# Patient Record
Sex: Male | Born: 1950 | Race: White | Hispanic: No | Marital: Married | Smoking: Former smoker
Health system: Southern US, Community
[De-identification: ages and names within clinical notes are randomized; demographics above are authoritative.]

## PROBLEM LIST (undated history)

## (undated) DIAGNOSIS — M1990 Unspecified osteoarthritis, unspecified site: Secondary | ICD-10-CM

## (undated) HISTORY — PX: OTHER SURGICAL HISTORY: SHX169

## (undated) HISTORY — PX: APPENDECTOMY: SHX54

---

## 1980-08-16 HISTORY — PX: OTHER SURGICAL HISTORY: SHX169

## 1998-05-23 ENCOUNTER — Other Ambulatory Visit: Admission: RE | Admit: 1998-05-23 | Discharge: 1998-05-23 | Payer: Self-pay | Admitting: Urology

## 2001-08-16 HISTORY — PX: OTHER SURGICAL HISTORY: SHX169

## 2002-09-18 ENCOUNTER — Encounter: Payer: Self-pay | Admitting: Neurosurgery

## 2002-09-20 ENCOUNTER — Inpatient Hospital Stay (HOSPITAL_COMMUNITY): Admission: RE | Admit: 2002-09-20 | Discharge: 2002-09-21 | Payer: Self-pay | Admitting: Neurosurgery

## 2011-09-22 ENCOUNTER — Ambulatory Visit (INDEPENDENT_AMBULATORY_CARE_PROVIDER_SITE_OTHER): Payer: BC Managed Care – PPO | Admitting: Family Medicine

## 2011-09-22 VITALS — BP 129/84 | HR 77 | Temp 97.9°F | Resp 16 | Ht 69.5 in | Wt 202.6 lb

## 2011-09-22 DIAGNOSIS — M542 Cervicalgia: Secondary | ICD-10-CM

## 2011-09-22 DIAGNOSIS — M545 Low back pain, unspecified: Secondary | ICD-10-CM

## 2011-09-22 DIAGNOSIS — M5430 Sciatica, unspecified side: Secondary | ICD-10-CM

## 2011-09-22 DIAGNOSIS — M79605 Pain in left leg: Secondary | ICD-10-CM

## 2011-09-22 NOTE — Progress Notes (Signed)
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 He has a history of spinal problems dating back to 1982 when, having back pain and left hip pain, he was found to have a disc problem in his neck. After the myelogram, Dr. long to spinal fusion in his symptoms resolve. He was fine until 2004 when he developed hand numbness leg pain. He had a trial of steroids which did not help. Her sugars spinal fusion with plate implants which resolved the problem. He has had intermittent stiff neck. He is taking ibuprofen for a while but about 6 months ago he developed left foot pain and numbness and started on Naprosyn that helped but in the last month or so he developed bilateral left lower extremity pain and numbness in left leg. He said some associated constipation but no other urinary problems or abdominal problems.  Objective: Patient's in no acute distress.  Neck range of motion is diminished with decreased rotation to the left. Reflexes show decreased are absent ankle jerks. knee jerks are normal. straight leg raising is normal.  Left knee crossover test rotation of pelvis is exquisitely tender. Abdomen is negative.  Skin is warm and dry.  There is no flank tenderness.  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 Sciatica which is worsening  Plan: Arrange for MRI of neck and lumbar spines and proceed depending on results.

## 2011-09-28 ENCOUNTER — Emergency Department (HOSPITAL_COMMUNITY): Payer: BC Managed Care – PPO

## 2011-09-28 ENCOUNTER — Emergency Department (HOSPITAL_COMMUNITY)
Admission: EM | Admit: 2011-09-28 | Discharge: 2011-09-28 | Disposition: A | Discharge status: Home / Self Care | Payer: BC Managed Care – PPO | Attending: Emergency Medicine | Admitting: Emergency Medicine

## 2011-09-28 ENCOUNTER — Encounter (HOSPITAL_COMMUNITY): Payer: Self-pay | Admitting: *Deleted

## 2011-09-28 DIAGNOSIS — M25519 Pain in unspecified shoulder: Secondary | ICD-10-CM | POA: Insufficient documentation

## 2011-09-28 DIAGNOSIS — M4802 Spinal stenosis, cervical region: Secondary | ICD-10-CM | POA: Insufficient documentation

## 2011-09-28 DIAGNOSIS — R29898 Other symptoms and signs involving the musculoskeletal system: Secondary | ICD-10-CM | POA: Insufficient documentation

## 2011-09-28 DIAGNOSIS — K5900 Constipation, unspecified: Secondary | ICD-10-CM | POA: Insufficient documentation

## 2011-09-28 DIAGNOSIS — M545 Low back pain, unspecified: Secondary | ICD-10-CM | POA: Insufficient documentation

## 2011-09-28 DIAGNOSIS — M48061 Spinal stenosis, lumbar region without neurogenic claudication: Secondary | ICD-10-CM

## 2011-09-28 DIAGNOSIS — Z981 Arthrodesis status: Secondary | ICD-10-CM | POA: Insufficient documentation

## 2011-09-28 DIAGNOSIS — M79609 Pain in unspecified limb: Secondary | ICD-10-CM | POA: Insufficient documentation

## 2011-09-28 DIAGNOSIS — R209 Unspecified disturbances of skin sensation: Secondary | ICD-10-CM | POA: Insufficient documentation

## 2011-09-28 MED ORDER — OXYCODONE-ACETAMINOPHEN 5-325 MG PO TABS: 1.0000 | ORAL_TABLET | ORAL | Status: DC | PRN

## 2011-09-28 MED ORDER — OXYCODONE-ACETAMINOPHEN 5-325 MG PO TABS: 2.0000 | ORAL_TABLET | ORAL | Status: DC | PRN

## 2011-09-28 MED ORDER — DEXAMETHASONE SODIUM PHOSPHATE 10 MG/ML IJ SOLN
10.0000 mg | Freq: Once | INTRAMUSCULAR | Status: AC
  Administered 2011-09-28: 10 mg via INTRAMUSCULAR
  Filled 2011-09-28: qty 1

## 2011-09-28 NOTE — ED Provider Notes (Signed)
Dr. Golda Acre has discussed patient with Dr. Epifania Gore.  Patient to follow-up with Dr. Phoebe Perch in the office--call tomorrow to schedule appointment.  Jimmye Norman, NP 09/28/11 [phone]

## 2011-09-28 NOTE — ED Notes (Signed)
Awaiting mri for bilateral foot numbness, left hand numbness, increased radiating lower back with movement, and electrical sensation radiating down left leg. States sx x 2 weeks but sx have gradually changing from a painful process to one of primarily numbness. Pt can ambulate but this is very painful after 65ft. Pt in no acute distress, a&ox4.

## 2011-09-28 NOTE — ED Notes (Signed)
Returned from mri 

## 2011-09-28 NOTE — ED Provider Notes (Signed)
History     CSN: [id number]  Arrival date & time 09/28/11  4098   First MD Initiated Contact with Patient 09/28/11 1025      Chief Complaint  Patient presents with  . Back Pain  . Numbness    (Consider location/radiation/quality/duration/timing/severity/associated sxs/prior treatment) Patient is a 61 y.o. male presenting with back pain. The history is provided by the patient.  Back Pain  This is a new problem. The current episode started more than 1 week ago. The problem occurs constantly. The problem has been gradually worsening. Associated symptoms include numbness and weakness. Pertinent negatives include no fever and no abdominal pain.  Pt states he is having pain in his lower back. States it started about 2 wks ago when he was shoveling snow. Gradually pain worsened, now radiating down both legs, left worse then right. States numbness to both feet. Weakness to left leg. States also weakness to both hands, has been dropping things in the last few days. Was seen by his PCP. Had MR ordered, but never had it done. States today, heart time ambulating. Denies fever, chills, loss of bowels, urinary retention or incontinence.   History reviewed. No pertinent past medical history.  Past Surgical History  Procedure Date  . Neck fusion     History reviewed. No pertinent family history.  History  Substance Use Topics  . Smoking status: Former Smoker    Quit date: 09/21/2008  . Smokeless tobacco: Not on file  . Alcohol Use: Not on file      Review of Systems  Constitutional: Negative for fever and chills.  HENT: Negative.   Eyes: Negative.   Respiratory: Negative.   Cardiovascular: Negative.   Gastrointestinal: Positive for constipation. Negative for abdominal pain and diarrhea.  Genitourinary: Negative for difficulty urinating.  Musculoskeletal: Positive for back pain and gait problem.  Skin: Negative.   Neurological: Positive for weakness and numbness.    Psychiatric/Behavioral: Negative.     Allergies  Review of patient's allergies indicates no known allergies.  Home Medications   Current Outpatient Rx  Name Route Sig Dispense Refill  . NAPROXEN SODIUM 220 MG PO TABS Oral Take 220 mg by mouth. 3 tabs am 3 tabs pm      BP 151/91  Pulse 99  Temp(Src) 98.4 F (36.9 C) (Oral)  Resp 20  SpO2 99%  Physical Exam  Nursing note and vitals reviewed. Constitutional: He is oriented to person, place, and time. He appears well-developed and well-nourished. No distress.  HENT:  Head: Normocephalic and atraumatic.  Eyes: Conjunctivae are normal.  Neck: Neck supple.  Cardiovascular: Normal rate, regular rhythm and normal heart sounds.   Pulmonary/Chest: Effort normal and breath sounds normal. No respiratory distress.  Abdominal: Soft. Bowel sounds are normal. He exhibits no distension.  Musculoskeletal: Normal range of motion. He exhibits no edema.       No midline cervical, thoracic, or lumbar tenderness. Pain with left straight leg raise.  Neurological: He is alert and oriented to person, place, and time.       Decreased sensation over lateral thighs bilaterally, lateral shins, dorsum of bilateral feet. Unable to dorsiflex left great toe. 5/5 grip strength bilaterally, however left grip is weaker then right. 2+ right patellar reflex, 1+ left patellar reflex  Skin: Skin is warm and dry. No rash noted. No erythema.  Psychiatric: He has a normal mood and affect.    ED Course  Procedures (including critical care time)  Pt with neuro deficits. Will get  MRI of cervical and lumbar spine to r/o cord compression.  No results found for this or any previous visit. Mr Cervical Spine Wo Contrast  09/28/2011  *RADIOLOGY REPORT*  Clinical Data: Neck pain radiating to both shoulders, especially the left.  Bilateral hand numbness, mostly left.  MRI CERVICAL SPINE WITHOUT CONTRAST  Technique:  Multiplanar and multiecho pulse sequences of the cervical  spine, to include the craniocervical junction and cervicothoracic junction, were obtained according to standard protocol without intravenous contrast.  Comparison: None.  Findings: The patient has undergone previous cervical fusions at C3- 4 and C5-C7.  C6-7 level appears solidly fused.  The other levels are incompletely evaluated due to susceptibility artifact.  Plain films could help assess the presence or absence of solid fusion.  There is anatomic alignment except for 3-4 mm of anterolisthesis C7 on T1 which is facet mediated.  This is a fairly significant amount of anterolisthesis for the cervical spine, particularly if there is dynamic instability.  Abnormal cord signal is seen at C3-4 and C5-6.  This may relate to cervical compression which predated the surgery.  Overall longus the sense of congenital stenosis with short pedicles.  There is a paucity of CSF surrounding the cortical levels.  The individual disc spaces were examined as follows:  C2-3:  Mild bulge. No protrusion or cord compression.  C3-4:  Left greater than right abnormal intramedullary signal likely representing gliosis. No current cord compression.  Residual osteophytic spurring is greater on the left.  Left greater than right C4 nerve root encroachment is likely.  C4-5:  The C4-5 level is unfused.  There is a central protrusion which flattens the cord and compresses both exiting C5 nerve roots. Canal diameter 7 mm.  No abnormal cord signal.  C5-6:  Previous ACDF.  It is not clear if this level is fused. Slight left greater than right abnormal cord signal, also likely gliosis from previous compression.  Residual osteophytic spurring on the left narrows the foramen and likely compresses the C6 nerve root. No residual cord compression.  C6-7:    Solid fusion.  No residual neural compression.  C7-T1:  3-4 mm of facet mediated slip.  Central bulging annular fibers.  Cord compression without abnormal cord signal.  Bilateral left greater than right  C8 nerve root encroachment.  IMPRESSION: Multilevel spondylosis as described. There are multiple areas of neural compression related to previously fused and unfused levels.  Abnormal cord signal at C3-4 and C5-6 likely predated surgical intervention, reflecting gliosis from longstanding cord compression.  See comments above.  Original Report Authenticated By: Elsie Stain, M.D.   Mr Lumbar Spine Wo Contrast  09/28/2011  *RADIOLOGY REPORT*  Clinical Data: Low back pain.  Bilateral feet numbness, worse on the left.  MRI LUMBAR SPINE WITHOUT CONTRAST  Technique:  Multiplanar and multiecho pulse sequences of the lumbar spine were obtained without intravenous contrast.  Comparison: None.  Findings: No vertebral body compression fracture.  Multilevel disc space narrowing.  1-2 mm anterolisthesis L3 forward on L4 is facet mediated. 2-3 mm retrolisthesis L5 on S1 is facet mediated also. Normal conus.  Congenital stenosis with short pedicles.  Marrow signal homogeneous.  No prevertebral or paraspinous masses.  The individual disc spaces are examined as follows:  L1-2:  There is a central and rightward protrusion.  Mild facet arthropathy.  Mild central canal stenosis.  Right L2 nerve root encroachment.  L2-3:  Moderate to severe congenital and acquired stenosis, with posterior element hypertrophy and central/leftward protrusion.  Left greater than right L3 nerve root encroachment.  No significant foraminal narrowing.  L3-4:  Moderate to severe congenital and acquired stenosis. Advanced posterior element hypertrophy.  Central and leftward protrusion extends into the left neural foramen.  Left greater than right L4 nerve root encroachment in the canal.  Left L3 nerve encroachment in the foramen.  L4-5:  Moderate to severe congenital and acquired stenosis. Posterior element hypertrophy.  Central and leftward protrusion. Left greater than right L5 nerve root encroachment.  Bilateral foraminal narrowing, greater on the left,  likely affects the left L4 nerve root.  L5-S1:  2 mm facet mediated retrolisthesis L5 on S1.  Central and leftward protrusion.  Moderate facet arthropathy.  Left S1 nerve root compression in the canal.  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  There is predominantly left sided nerve root encroachment at each of those levels.  See comments above.  Original Report Authenticated By: Elsie Stain, M.D.    MRI resulted. Dr. Golda Acre to discuss pt's case with Dr. Blanche East, disposition by PA Katrinka Blazing.  No diagnosis found.    MDM          Lottie Mussel, PA 09/29/11 1512

## 2011-09-28 NOTE — ED Notes (Signed)
Awaiting neuro consult.

## 2011-09-28 NOTE — ED Notes (Signed)
Pt sitting up in bed watching TV with no verbal complaints at this time.  Awaiting evaluation from neurology.

## 2011-09-28 NOTE — ED Notes (Addendum)
Medical screening examination/treatment/procedure(s) were conducted as a shared visit with non-physician practitioner(s) and myself.  I personally evaluated the patient during the encounter  Patient presents with worsening numbness in the medial aspect of his left forearm and fifth digit as well as worsening weakness and pain down his left leg.  Patient notes that started abruptly one to 2 weeks ago with no specific inciting injury.  He has had 2 past cervical spine surgeries.  He notes that the pain in particular in his leg is getting worse and making it more difficult to ambulate as he is Dara crutches from a family member to assist him.  He notes he can walk short distances but on longer distances beyond going across a room he starts to lose his balance as he feels the numbness in his feet are causing difficulties.  Patient has difficulty with dorsiflexing his left foot as well.   Pt did have an MRI completed which does show some faded which shows some disc protrusion on multiple lumbar levels as well cervical levels on that could certainly give the patient his symptoms he is described.  I am waiting to talk with neurosurgery to discuss further followup for this patient.  Nat Christen, MD 09/28/11 1623  Patient was discussed with Dr. Danielle Dess and he agrees to this patient to be followed up as an outpatient with Dr. Phoebe Perch.  He has actually seen the patient's MRIs and does not see anything acute at this time.  Nat Christen, MD 09/28/11 [phone]

## 2011-09-28 NOTE — ED Notes (Signed)
Pt states after shoveling snow a cple weeks ago he started to have lower back pain. Seen by [facility]cc and had MRI done. Now with numbness in both feet, left upper arm, and left small finger. States he has had neck surgery with same symptoms.

## 2011-09-28 NOTE — ED Notes (Signed)
Patient transported to MRI 

## 2011-09-28 NOTE — ED Notes (Signed)
Pt states that he has taken 3 naproxen this morning. States that his pain with movement is about a 6 and when he is just sitting pain is approximately a 2

## 2011-10-01 ENCOUNTER — Other Ambulatory Visit: Payer: Self-pay | Admitting: Family Medicine

## 2011-10-01 DIAGNOSIS — M79604 Pain in right leg: Secondary | ICD-10-CM

## 2011-10-01 NOTE — ED Provider Notes (Signed)
Medical screening examination/treatment/procedure(s) were conducted as a shared visit with non-physician practitioner(s) and myself.  I personally evaluated the patient during the encounter   Wania Longstreth A Alphonsine Minium, MD 10/01/11 0901 

## 2011-10-01 NOTE — ED Provider Notes (Signed)
Medical screening examination/treatment/procedure(s) were conducted as a shared visit with non-physician practitioner(s) and myself.  I personally evaluated the patient during the encounter   Nat Christen, MD 10/01/11 [phone]

## 2011-10-06 ENCOUNTER — Other Ambulatory Visit: Payer: Self-pay | Admitting: Neurosurgery

## 2011-10-08 ENCOUNTER — Encounter (HOSPITAL_COMMUNITY): Payer: Self-pay | Admitting: Pharmacy Technician

## 2011-10-08 ENCOUNTER — Encounter (HOSPITAL_COMMUNITY): Payer: Self-pay | Admitting: *Deleted

## 2011-10-10 MED ORDER — CEFAZOLIN SODIUM-DEXTROSE 2-3 GM-% IV SOLR
2.0000 g | INTRAVENOUS | Status: AC
  Administered 2011-10-11: 2 g via INTRAVENOUS
  Filled 2011-10-10: qty 50

## 2011-10-11 ENCOUNTER — Encounter (HOSPITAL_COMMUNITY): Payer: Self-pay | Admitting: *Deleted

## 2011-10-11 ENCOUNTER — Encounter (HOSPITAL_COMMUNITY): Payer: Self-pay

## 2011-10-11 ENCOUNTER — Inpatient Hospital Stay (HOSPITAL_COMMUNITY)
Admission: RE | Admit: 2011-10-11 | Discharge: 2011-10-12 | DRG: 758 | Disposition: A | Discharge status: Home / Self Care | Payer: BC Managed Care – PPO | Source: Ambulatory Visit | Attending: Neurosurgery | Admitting: Neurosurgery

## 2011-10-11 ENCOUNTER — Ambulatory Visit (HOSPITAL_COMMUNITY): Payer: BC Managed Care – PPO | Admitting: *Deleted

## 2011-10-11 ENCOUNTER — Encounter (HOSPITAL_COMMUNITY)
Admission: RE | Disposition: A | Discharge status: Home / Self Care | Payer: Self-pay | Source: Ambulatory Visit | Attending: Neurosurgery

## 2011-10-11 ENCOUNTER — Ambulatory Visit (HOSPITAL_COMMUNITY): Payer: BC Managed Care – PPO

## 2011-10-11 DIAGNOSIS — M5126 Other intervertebral disc displacement, lumbar region: Secondary | ICD-10-CM | POA: Diagnosis present

## 2011-10-11 DIAGNOSIS — M48061 Spinal stenosis, lumbar region without neurogenic claudication: Principal | ICD-10-CM | POA: Diagnosis present

## 2011-10-11 DIAGNOSIS — Z79899 Other long term (current) drug therapy: Secondary | ICD-10-CM

## 2011-10-11 HISTORY — PX: LUMBAR LAMINECTOMY/DECOMPRESSION MICRODISCECTOMY: SHX5026

## 2011-10-11 HISTORY — DX: Unspecified osteoarthritis, unspecified site: M19.90

## 2011-10-11 LAB — CBC
MCHC: 35.9 g/dL (ref 30.0–36.0)
MCV: 95.9 fL (ref 78.0–100.0)
Platelets: 322 10*3/uL (ref 150–400)
RDW: 13.5 % (ref 11.5–15.5)
WBC: 12 10*3/uL — ABNORMAL HIGH (ref 4.0–10.5)

## 2011-10-11 LAB — URINALYSIS, ROUTINE W REFLEX MICROSCOPIC
Bilirubin Urine: NEGATIVE
Glucose, UA: NEGATIVE mg/dL
Hgb urine dipstick: NEGATIVE
Ketones, ur: 15 mg/dL — AB
Protein, ur: NEGATIVE mg/dL
Urobilinogen, UA: 0.2 mg/dL (ref 0.0–1.0)

## 2011-10-11 LAB — SURGICAL PCR SCREEN: MRSA, PCR: NEGATIVE

## 2011-10-11 LAB — BASIC METABOLIC PANEL
BUN: 24 mg/dL — ABNORMAL HIGH (ref 6–23)
CO2: 23 mEq/L (ref 19–32)
Calcium: 9.1 mg/dL (ref 8.4–10.5)
Creatinine, Ser: 0.9 mg/dL (ref 0.50–1.35)
GFR calc Af Amer: >90 mL/min (ref >90)

## 2011-10-11 LAB — DIFFERENTIAL
Basophils Absolute: 0.1 10*3/uL (ref 0.0–0.1)
Basophils Relative: 1 % (ref 0–1)
Eosinophils Relative: 1 % (ref 0–5)
Lymphocytes Relative: 20 % (ref 12–46)
Monocytes Absolute: 1.1 10*3/uL — ABNORMAL HIGH (ref 0.1–1.0)

## 2011-10-11 LAB — PROTIME-INR: Prothrombin Time: 14.7 seconds (ref 11.6–15.2)

## 2011-10-11 LAB — APTT: aPTT: 34 seconds (ref 24–37)

## 2011-10-11 SURGERY — LUMBAR LAMINECTOMY/DECOMPRESSION MICRODISCECTOMY 4 LEVEL
Anesthesia: General | Site: Back | Wound class: Clean

## 2011-10-11 MED ORDER — MUPIROCIN 2 % EX OINT
TOPICAL_OINTMENT | Freq: Two times a day (BID) | CUTANEOUS | Status: DC
  Administered 2011-10-11: 22:00:00 via NASAL
  Administered 2011-10-12: 1 via NASAL
  Filled 2011-10-11: qty 22

## 2011-10-11 MED ORDER — FENTANYL CITRATE 0.05 MG/ML IJ SOLN
INTRAMUSCULAR | Status: DC | PRN
  Administered 2011-10-11: 50 ug via INTRAVENOUS
  Administered 2011-10-11 (×2): 100 ug via INTRAVENOUS
  Administered 2011-10-11: 50 ug via INTRAVENOUS
  Administered 2011-10-11 (×2): 25 ug via INTRAVENOUS
  Administered 2011-10-11: 50 ug via INTRAVENOUS

## 2011-10-11 MED ORDER — PROMETHAZINE HCL 25 MG/ML IJ SOLN: 12.5000 mg | INTRAMUSCULAR | Status: DC | PRN

## 2011-10-11 MED ORDER — KCL IN DEXTROSE-NACL 20-5-0.45 MEQ/L-%-% IV SOLN
INTRAVENOUS | Status: DC
  Filled 2011-10-11 (×5): qty 1000

## 2011-10-11 MED ORDER — HYDROCODONE-ACETAMINOPHEN 5-325 MG PO TABS: 1.0000 | ORAL_TABLET | ORAL | Status: DC | PRN

## 2011-10-11 MED ORDER — KETOROLAC TROMETHAMINE 30 MG/ML IJ SOLN
30.0000 mg | Freq: Four times a day (QID) | INTRAMUSCULAR | Status: DC
  Administered 2011-10-11 – 2011-10-12 (×3): 30 mg via INTRAVENOUS
  Filled 2011-10-11 (×4): qty 1

## 2011-10-11 MED ORDER — PROMETHAZINE HCL 25 MG/ML IJ SOLN: 6.2500 mg | INTRAMUSCULAR | Status: DC | PRN

## 2011-10-11 MED ORDER — KCL IN DEXTROSE-NACL 20-5-0.45 MEQ/L-%-% IV SOLN
INTRAVENOUS | Status: AC
  Filled 2011-10-11: qty 1000

## 2011-10-11 MED ORDER — MAGNESIUM HYDROXIDE 400 MG/5ML PO SUSP: 30.0000 mL | Freq: Every day | ORAL | Status: DC | PRN

## 2011-10-11 MED ORDER — CEFAZOLIN SODIUM 1-5 GM-% IV SOLN
1.0000 g | Freq: Three times a day (TID) | INTRAVENOUS | Status: AC
  Administered 2011-10-11 – 2011-10-12 (×2): 1 g via INTRAVENOUS
  Filled 2011-10-11 (×2): qty 50

## 2011-10-11 MED ORDER — LACTATED RINGERS IV SOLN: INTRAVENOUS | Status: DC

## 2011-10-11 MED ORDER — ACETAMINOPHEN 325 MG PO TABS: 650.0000 mg | ORAL_TABLET | ORAL | Status: DC | PRN

## 2011-10-11 MED ORDER — LIDOCAINE-EPINEPHRINE 1 %-1:100000 IJ SOLN
INTRAMUSCULAR | Status: DC | PRN
  Administered 2011-10-11: 20 mL

## 2011-10-11 MED ORDER — MORPHINE SULFATE 4 MG/ML IJ SOLN: 1.0000 mg | INTRAMUSCULAR | Status: DC | PRN

## 2011-10-11 MED ORDER — EPHEDRINE SULFATE 50 MG/ML IJ SOLN
INTRAMUSCULAR | Status: DC | PRN
  Administered 2011-10-11 (×2): 5 mg via INTRAVENOUS

## 2011-10-11 MED ORDER — ONDANSETRON HCL 4 MG/2ML IJ SOLN
INTRAMUSCULAR | Status: DC | PRN
  Administered 2011-10-11: 4 mg via INTRAVENOUS

## 2011-10-11 MED ORDER — NEOSTIGMINE METHYLSULFATE 1 MG/ML IJ SOLN
INTRAMUSCULAR | Status: DC | PRN
  Administered 2011-10-11: 4 mg via INTRAVENOUS

## 2011-10-11 MED ORDER — 0.9 % SODIUM CHLORIDE (POUR BTL) OPTIME
TOPICAL | Status: DC | PRN
  Administered 2011-10-11: 1000 mL

## 2011-10-11 MED ORDER — THROMBIN 5000 UNITS EX KIT
PACK | CUTANEOUS | Status: DC | PRN
  Administered 2011-10-11 (×2): 5000 [IU] via TOPICAL

## 2011-10-11 MED ORDER — PROPOFOL 10 MG/ML IV EMUL
INTRAVENOUS | Status: DC | PRN
  Administered 2011-10-11: 200 mg via INTRAVENOUS

## 2011-10-11 MED ORDER — SODIUM CHLORIDE 0.9 % IR SOLN
Status: DC | PRN
  Administered 2011-10-11: 12:00:00

## 2011-10-11 MED ORDER — ZOLPIDEM TARTRATE 5 MG PO TABS: 10.0000 mg | ORAL_TABLET | Freq: Every evening | ORAL | Status: DC | PRN

## 2011-10-11 MED ORDER — SODIUM CHLORIDE 0.9 % IJ SOLN
3.0000 mL | Freq: Two times a day (BID) | INTRAMUSCULAR | Status: DC
  Administered 2011-10-11 – 2011-10-12 (×2): 3 mL via INTRAVENOUS

## 2011-10-11 MED ORDER — MUPIROCIN 2 % EX OINT
TOPICAL_OINTMENT | CUTANEOUS | Status: AC
  Administered 2011-10-11: 1 via NASAL
  Filled 2011-10-11: qty 22

## 2011-10-11 MED ORDER — DOCUSATE SODIUM 100 MG PO CAPS
100.0000 mg | ORAL_CAPSULE | Freq: Two times a day (BID) | ORAL | Status: DC
  Administered 2011-10-11 – 2011-10-12 (×2): 100 mg via ORAL
  Filled 2011-10-11: qty 1

## 2011-10-11 MED ORDER — MIDAZOLAM HCL 2 MG/2ML IJ SOLN
INTRAMUSCULAR | Status: AC
  Filled 2011-10-11: qty 2

## 2011-10-11 MED ORDER — PHENOL 1.4 % MT LIQD: 1.0000 | OROMUCOSAL | Status: DC | PRN

## 2011-10-11 MED ORDER — FLEET ENEMA 7-19 GM/118ML RE ENEM
1.0000 | ENEMA | Freq: Once | RECTAL | Status: AC | PRN
  Filled 2011-10-11: qty 1

## 2011-10-11 MED ORDER — BACITRACIN 50000 UNITS IM SOLR
INTRAMUSCULAR | Status: AC
  Filled 2011-10-11: qty 1

## 2011-10-11 MED ORDER — MORPHINE SULFATE 4 MG/ML IJ SOLN
INTRAMUSCULAR | Status: AC
  Administered 2011-10-11: 4 mg
  Filled 2011-10-11: qty 1

## 2011-10-11 MED ORDER — ONDANSETRON HCL 4 MG/2ML IJ SOLN: 4.0000 mg | INTRAMUSCULAR | Status: DC | PRN

## 2011-10-11 MED ORDER — GLYCOPYRROLATE 0.2 MG/ML IJ SOLN
INTRAMUSCULAR | Status: DC | PRN
  Administered 2011-10-11: .8 mg via INTRAVENOUS

## 2011-10-11 MED ORDER — METHOCARBAMOL 100 MG/ML IJ SOLN
500.0000 mg | Freq: Four times a day (QID) | INTRAVENOUS | Status: DC | PRN
  Administered 2011-10-11: 500 mg via INTRAVENOUS
  Filled 2011-10-11: qty 5

## 2011-10-11 MED ORDER — ACETAMINOPHEN 650 MG RE SUPP: 650.0000 mg | RECTAL | Status: DC | PRN

## 2011-10-11 MED ORDER — HYDROMORPHONE HCL PF 1 MG/ML IJ SOLN
0.2500 mg | INTRAMUSCULAR | Status: DC | PRN
  Administered 2011-10-11: 0.5 mg via INTRAVENOUS

## 2011-10-11 MED ORDER — ROCURONIUM BROMIDE 100 MG/10ML IV SOLN
INTRAVENOUS | Status: DC | PRN
  Administered 2011-10-11: 50 mg via INTRAVENOUS
  Administered 2011-10-11 (×3): 10 mg via INTRAVENOUS

## 2011-10-11 MED ORDER — MENTHOL 3 MG MT LOZG: 1.0000 | LOZENGE | OROMUCOSAL | Status: DC | PRN

## 2011-10-11 MED ORDER — CYCLOBENZAPRINE HCL 10 MG PO TABS
10.0000 mg | ORAL_TABLET | Freq: Three times a day (TID) | ORAL | Status: DC | PRN
  Administered 2011-10-11 – 2011-10-12 (×2): 10 mg via ORAL
  Filled 2011-10-11 (×2): qty 1

## 2011-10-11 MED ORDER — SODIUM CHLORIDE 0.9 % IV SOLN
INTRAVENOUS | Status: AC
  Filled 2011-10-11: qty 500

## 2011-10-11 MED ORDER — BISACODYL 10 MG RE SUPP: 10.0000 mg | Freq: Every day | RECTAL | Status: DC | PRN

## 2011-10-11 MED ORDER — HEMOSTATIC AGENTS (NO CHARGE) OPTIME
TOPICAL | Status: DC | PRN
  Administered 2011-10-11: 1 via TOPICAL

## 2011-10-11 MED ORDER — LACTATED RINGERS IV SOLN
INTRAVENOUS | Status: DC | PRN
  Administered 2011-10-11 (×3): via INTRAVENOUS

## 2011-10-11 MED ORDER — PROMETHAZINE HCL 25 MG PO TABS: 12.5000 mg | ORAL_TABLET | ORAL | Status: DC | PRN

## 2011-10-11 MED ORDER — METHOCARBAMOL 500 MG PO TABS
500.0000 mg | ORAL_TABLET | Freq: Four times a day (QID) | ORAL | Status: DC | PRN
Start: 2011-10-11 — End: 2011-10-12
  Administered 2011-10-12: 500 mg via ORAL
  Filled 2011-10-11 (×2): qty 1

## 2011-10-11 MED ORDER — SODIUM CHLORIDE 0.9 % IJ SOLN: 3.0000 mL | INTRAMUSCULAR | Status: DC | PRN

## 2011-10-11 MED ORDER — OXYCODONE-ACETAMINOPHEN 5-325 MG PO TABS
1.0000 | ORAL_TABLET | ORAL | Status: DC | PRN
  Administered 2011-10-11 – 2011-10-12 (×4): 2 via ORAL
  Filled 2011-10-11 (×4): qty 2

## 2011-10-11 MED ORDER — MIDAZOLAM HCL 5 MG/5ML IJ SOLN
INTRAMUSCULAR | Status: DC | PRN
  Administered 2011-10-11: 2 mg via INTRAVENOUS

## 2011-10-11 MED ORDER — KETOROLAC TROMETHAMINE 30 MG/ML IJ SOLN
INTRAMUSCULAR | Status: AC
  Administered 2011-10-11: 30 mg
  Filled 2011-10-11: qty 1

## 2011-10-11 MED ORDER — HYDROMORPHONE HCL PF 1 MG/ML IJ SOLN
INTRAMUSCULAR | Status: AC
  Filled 2011-10-11: qty 1

## 2011-10-11 SURGICAL SUPPLY — 58 items
APL SKNCLS STERI-STRIP NONHPOA (GAUZE/BANDAGES/DRESSINGS) ×1
BAG DECANTER FOR FLEXI CONT (MISCELLANEOUS) ×2 IMPLANT
BENZOIN TINCTURE PRP APPL 2/3 (GAUZE/BANDAGES/DRESSINGS) ×2 IMPLANT
BLADE SURG ROTATE 9660 (MISCELLANEOUS) ×1 IMPLANT
BUR ACORN 6.0 ACORN (BURR) ×2 IMPLANT
BUR ACRON 5.0MM COATED (BURR) ×2 IMPLANT
CANISTER SUCTION 2500CC (MISCELLANEOUS) ×2 IMPLANT
CLOSURE STERI STRIP 1/2 X4 (GAUZE/BANDAGES/DRESSINGS) ×1 IMPLANT
CLOTH BEACON ORANGE TIMEOUT ST (SAFETY) ×2 IMPLANT
CONT SPEC 4OZ CLIKSEAL STRL BL (MISCELLANEOUS) ×1 IMPLANT
DRAPE LAPAROTOMY 100X72X124 (DRAPES) ×2 IMPLANT
DRAPE MICROSCOPE LEICA (MISCELLANEOUS) ×2 IMPLANT
DRAPE POUCH INSTRU U-SHP 10X18 (DRAPES) ×2 IMPLANT
DRAPE SURG 17X23 STRL (DRAPES) ×2 IMPLANT
DRESSING TELFA 8X3 (GAUZE/BANDAGES/DRESSINGS) ×2 IMPLANT
DURAPREP 26ML APPLICATOR (WOUND CARE) ×2 IMPLANT
ELECT REM PT RETURN 9FT ADLT (ELECTROSURGICAL) ×2
ELECTRODE REM PT RTRN 9FT ADLT (ELECTROSURGICAL) ×1 IMPLANT
GAUZE SPONGE 4X4 12PLY STRL LF (GAUZE/BANDAGES/DRESSINGS) ×1 IMPLANT
GAUZE SPONGE 4X4 16PLY XRAY LF (GAUZE/BANDAGES/DRESSINGS) ×1 IMPLANT
GLOVE BIOGEL PI IND STRL 7.0 (GLOVE) IMPLANT
GLOVE BIOGEL PI IND STRL 7.5 (GLOVE) IMPLANT
GLOVE BIOGEL PI INDICATOR 7.0 (GLOVE) ×2
GLOVE BIOGEL PI INDICATOR 7.5 (GLOVE) ×1
GLOVE ECLIPSE 7.5 STRL STRAW (GLOVE) ×4 IMPLANT
GLOVE EXAM NITRILE LRG STRL (GLOVE) IMPLANT
GLOVE EXAM NITRILE MD LF STRL (GLOVE) ×1 IMPLANT
GLOVE EXAM NITRILE XL STR (GLOVE) IMPLANT
GLOVE EXAM NITRILE XS STR PU (GLOVE) IMPLANT
GLOVE OPTIFIT SS 6.5 STRL BRWN (GLOVE) ×3 IMPLANT
GOWN BRE IMP SLV AUR LG STRL (GOWN DISPOSABLE) ×3 IMPLANT
GOWN BRE IMP SLV AUR XL STRL (GOWN DISPOSABLE) ×3 IMPLANT
GOWN STRL REIN 2XL LVL4 (GOWN DISPOSABLE) IMPLANT
KIT BASIN OR (CUSTOM PROCEDURE TRAY) ×2 IMPLANT
KIT ROOM TURNOVER OR (KITS) ×2 IMPLANT
NDL HYPO 18GX1.5 BLUNT FILL (NEEDLE) IMPLANT
NEEDLE HYPO 18GX1.5 BLUNT FILL (NEEDLE) IMPLANT
NEEDLE HYPO 22GX1.5 SAFETY (NEEDLE) ×4 IMPLANT
NS IRRIG 1000ML POUR BTL (IV SOLUTION) ×2 IMPLANT
PACK LAMINECTOMY NEURO (CUSTOM PROCEDURE TRAY) ×2 IMPLANT
PAD ARMBOARD 7.5X6 YLW CONV (MISCELLANEOUS) ×6 IMPLANT
PATTIES SURGICAL .75X.75 (GAUZE/BANDAGES/DRESSINGS) ×2 IMPLANT
RUBBERBAND STERILE (MISCELLANEOUS) ×4 IMPLANT
SPONGE GAUZE 4X4 12PLY (GAUZE/BANDAGES/DRESSINGS) ×2 IMPLANT
SPONGE LAP 4X18 X RAY DECT (DISPOSABLE) IMPLANT
SPONGE SURGIFOAM ABS GEL SZ50 (HEMOSTASIS) ×2 IMPLANT
STRIP CLOSURE SKIN 1/2X4 (GAUZE/BANDAGES/DRESSINGS) ×2 IMPLANT
SUT PROLENE 6 0 BV (SUTURE) IMPLANT
SUT VIC AB 0 CT1 18XCR BRD8 (SUTURE) ×1 IMPLANT
SUT VIC AB 0 CT1 8-18 (SUTURE) ×4
SUT VIC AB 2-0 CP2 18 (SUTURE) ×3 IMPLANT
SUT VIC AB 3-0 SH 8-18 (SUTURE) ×3 IMPLANT
SYR 20CC LL (SYRINGE) ×2 IMPLANT
SYR 5ML LL (SYRINGE) IMPLANT
TAPE CLOTH SURG 4X10 WHT LF (GAUZE/BANDAGES/DRESSINGS) ×1 IMPLANT
TOWEL OR 17X24 6PK STRL BLUE (TOWEL DISPOSABLE) ×2 IMPLANT
TOWEL OR 17X26 10 PK STRL BLUE (TOWEL DISPOSABLE) ×2 IMPLANT
WATER STERILE IRR 1000ML POUR (IV SOLUTION) ×2 IMPLANT

## 2011-10-11 NOTE — Anesthesia Postprocedure Evaluation (Signed)
  Anesthesia Post-op Note  Patient: Roger Wilcox  Procedure(s) Performed: Procedure(s) (LRB): LUMBAR LAMINECTOMY/DECOMPRESSION MICRODISCECTOMY 4 LEVEL (N/A)  Patient Location: PACU  Anesthesia Type: General  Level of Consciousness: awake, alert  and oriented  Airway and Oxygen Therapy: Patient Spontanous Breathing and Patient connected to nasal cannula oxygen  Post-op Pain: none  Post-op Assessment: Post-op Vital signs reviewed, Patient's Cardiovascular Status Stable, Respiratory Function Stable, Patent Airway, No signs of Nausea or vomiting and Pain level controlled  Post-op Vital Signs: Reviewed and stable  Complications: No apparent anesthesia complications

## 2011-10-11 NOTE — Transfer of Care (Signed)
Immediate Anesthesia Transfer of Care Note  Patient: Roger Wilcox  Procedure(s) Performed: Procedure(s) (LRB): LUMBAR LAMINECTOMY/DECOMPRESSION MICRODISCECTOMY 4 LEVEL (N/A)  Patient Location: PACU  Anesthesia Type: General  Level of Consciousness: awake and alert   Airway & Oxygen Therapy: Patient Spontanous Breathing and Patient connected to nasal cannula oxygen  Post-op Assessment: Report given to PACU RN and Post -op Vital signs reviewed and stable  Post vital signs: Reviewed and stable  Complications: No apparent anesthesia complications

## 2011-10-11 NOTE — Op Note (Signed)
10/11/2011  1:42 PM  PATIENT:  Margit Banda Hanken  61 y.o. male  PRE-OPERATIVE DIAGNOSIS:  Lumbar stenosis  POST-OPERATIVE DIAGNOSIS:  Lumbar stenosis  PROCEDURE:  Procedure(s): LUMBAR LAMINECTOMY/DECOMPRESSION  5LEVEL  SURGEON:  Surgeon(s): Clydene Fake, MD Temple Pacini, MD  ASSISTANTS:  ANESTHESIA:   general  EBL:  Total I/O In: 2000 [I.V.:2000] Out: 600 [Urine:400; Blood:200]  BLOOD ADMINISTERED:none  DRAINS: none   SPECIMEN:  No Specimen  DICTATION: Patient with back and left leg pain of the head of the canal stenosis with spinal change causing significant stenosis from L2-S1 with disc protrusions and left-sided 34 and 51 patient brought in for decompressive laminectomy.  Patient brought into the operating room general anesthesia induced patient placed in a prone position Wilson frame all pressure points padded. Patient prepped draped sterile fashion the incision injected with 20 cc 1% lidocaine with epinephrine. Incision was then made in the midline of the lower lumbar spine. Cementing of the fascia hemostasis obtained with Bovie cauterization the fascia was incised with the Bovie and subperiosteal dissection was done with a spinous process lamina. It was done from L2-S1 bilaterally. Retractors were then placed markers were placed at the 23 and 51 disc spaces and x-rays obtained confirming or positioning.  Decompressive laminectomy was then done with rongeurs a high-speed drill and Kerrison punches removing the bone of the L2 spinous process lamina to the top part of the S1 spinous process lamina. This decompressed the central canal. Decompress the lateral gutters with high-speed drill and Kerrison punches so we had good decompression and then foraminotomies were done at L2-L3 L4-L5 and S1 bilaterally. We did decompression of nerve root and the central canal. We explored the disc spaces especially at the left-sided 3 for which the decompression we did we felt we had we did not get  into the disc space removing disc. A subsequent left-sided L5-S1 there was a paracentral protrusion of disc this is immediate removed without entering the disc space and in this case make sure we had good decompression of the nerve root.. About solution hemostasis retractors removed fascia closed with 0 interrupted sutures of his subcutaneous tissue closed with 02 0 Vicryl interrupted sutures skin closed benzoin Steri-Strips dressing was placed this was placed back in supine position transferred to recovery room  PLAN OF CARE: Admit for overnight observation  PATIENT DISPOSITION:  PACU - hemodynamically stable.

## 2011-10-11 NOTE — Interval H&P Note (Signed)
History and Physical Interval Note:  10/11/2011 10:54 AM  Roger Wilcox  has presented today for surgery, with the diagnosis of Lumbar stenosis  The various methods of treatment have been discussed with the patient and family. After consideration of risks, benefits and other options for treatment, the patient has consented to  Procedure(s) (LRB): LUMBAR LAMINECTOMY/DECOMPRESSION MICRODISCECTOMY 4 LEVEL (N/A) as a surgical intervention .  The patients' history has been reviewed, patient examined, no change in status, stable for surgery.  I have reviewed the patients' chart and labs.  Questions were answered to the patient's satisfaction.     Poetry Cerro R

## 2011-10-11 NOTE — Progress Notes (Signed)
Report to Danella Sensing, RN for primary caregiver.

## 2011-10-11 NOTE — Preoperative (Signed)
Beta Blockers   Reason not to administer Beta Blockers:Not Applicable 

## 2011-10-11 NOTE — H&P (Signed)
See H& P.

## 2011-10-11 NOTE — Anesthesia Preprocedure Evaluation (Signed)
Anesthesia Evaluation  Patient identified by MRN, date of birth, ID band Patient awake    Reviewed: Allergy & Precautions, H&P , NPO status , Patient's Chart, lab work & pertinent test results  History of Anesthesia Complications Negative for: history of anesthetic complications  Airway Mallampati: I TM Distance: >3 FB Neck ROM: Full    Dental No notable dental hx. (+) Teeth Intact and Dental Advisory Given   Pulmonary former smoker clear to auscultation  Pulmonary exam normal       Cardiovascular neg cardio ROS Regular Normal    Neuro/Psych Back pain, radiates to L leg    GI/Hepatic negative GI ROS, Neg liver ROS,   Endo/Other  Negative Endocrine ROS  Renal/GU negative Renal ROS     Musculoskeletal   Abdominal (+) obese,   Peds  Hematology   Anesthesia Other Findings   Reproductive/Obstetrics                           Anesthesia Physical Anesthesia Plan  ASA: II  Anesthesia Plan: General   Post-op Pain Management:    Induction: Intravenous  Airway Management Planned: Oral ETT  Additional Equipment:   Intra-op Plan:   Post-operative Plan: Extubation in OR  Informed Consent: I have reviewed the patients History and Physical, chart, labs and discussed the procedure including the risks, benefits and alternatives for the proposed anesthesia with the patient or authorized representative who has indicated his/her understanding and acceptance.   Dental advisory given  Plan Discussed with: CRNA and Surgeon  Anesthesia Plan Comments: (Plan routine monitors, GETA)        Anesthesia Quick Evaluation

## 2011-10-12 MED ORDER — CYCLOBENZAPRINE HCL 10 MG PO TABS: 10.0000 mg | ORAL_TABLET | Freq: Three times a day (TID) | ORAL | Status: AC | PRN

## 2011-10-12 MED ORDER — OXYCODONE-ACETAMINOPHEN 5-325 MG PO TABS: 1.0000 | ORAL_TABLET | ORAL | Status: AC | PRN

## 2011-10-12 NOTE — Discharge Summary (Signed)
Physician Discharge Summary  Patient ID: Roger Wilcox MRN: [id number] DOB/AGE: [birth year] 60 y.o.  Admit date: 10/11/2011 Discharge date: 10/12/2011  Admission Diagnoses:Lumbar stenosis   Discharge Diagnoses: Lumbar stenosis  Active Problems:  * No active hospital problems. *    Discharged Condition: good  Hospital Course: pt admitted day of surgery, underwent procedure below - py up ambulating, voiding, decrease leg pain -   Consults: None    Treatments: surgery: LUMBAR LAMINECTOMY/DECOMPRESSION 5LEVEL   Discharge Exam: Blood pressure 97/68, pulse 73, temperature 98 F (36.7 C), temperature source Oral, resp. rate 16, height 5\' 11"  (1.803 m), weight 90.719 kg (200 lb), SpO2 98.00%. Wound:c/d/i  Disposition: home   Medication List  As of 10/12/2011 10:17 AM   STOP taking these medications         naproxen sodium 220 MG tablet         TAKE these medications         cyclobenzaprine 10 MG tablet   Commonly known as: FLEXERIL   Take 1 tablet (10 mg total) by mouth 3 (three) times daily as needed for muscle spasms.      diclofenac 75 MG EC tablet   Commonly known as: VOLTAREN   Take 75 mg by mouth 2 (two) times daily.      oxycodone-acetaminophen 5-500 MG per tablet   Commonly known as: ROXICET   Take 1 tablet by mouth every 4 (four) hours as needed.      oxyCODONE-acetaminophen 5-325 MG per tablet   Commonly known as: PERCOCET   Take 1-2 tablets by mouth every 4 (four) hours as needed.      vitamin C 500 MG tablet   Commonly known as: ASCORBIC ACID   Take 500 mg by mouth daily.             SignedClydene Fake, MD 10/12/2011, 10:17 AM

## 2011-10-14 ENCOUNTER — Encounter (HOSPITAL_COMMUNITY): Payer: Self-pay | Admitting: Neurosurgery

## 2012-07-16 ENCOUNTER — Encounter (HOSPITAL_COMMUNITY): Payer: Self-pay | Admitting: Emergency Medicine

## 2012-07-16 ENCOUNTER — Encounter (HOSPITAL_COMMUNITY)
Admission: EM | Disposition: A | Discharge status: Home / Self Care | Payer: Self-pay | Source: Home / Self Care | Attending: Emergency Medicine

## 2012-07-16 ENCOUNTER — Emergency Department (HOSPITAL_COMMUNITY): Payer: BC Managed Care – PPO

## 2012-07-16 ENCOUNTER — Observation Stay (HOSPITAL_COMMUNITY)
Admission: EM | Admit: 2012-07-16 | Discharge: 2012-07-17 | Disposition: A | Discharge status: Home / Self Care | Payer: BC Managed Care – PPO | Attending: Orthopedic Surgery | Admitting: Orthopedic Surgery

## 2012-07-16 ENCOUNTER — Observation Stay (HOSPITAL_COMMUNITY): Payer: BC Managed Care – PPO | Admitting: Anesthesiology

## 2012-07-16 ENCOUNTER — Encounter (HOSPITAL_COMMUNITY): Payer: Self-pay | Admitting: Anesthesiology

## 2012-07-16 ENCOUNTER — Observation Stay (HOSPITAL_COMMUNITY): Payer: BC Managed Care – PPO

## 2012-07-16 DIAGNOSIS — S838X9A Sprain of other specified parts of unspecified knee, initial encounter: Secondary | ICD-10-CM | POA: Insufficient documentation

## 2012-07-16 DIAGNOSIS — R296 Repeated falls: Secondary | ICD-10-CM | POA: Insufficient documentation

## 2012-07-16 DIAGNOSIS — M249 Joint derangement, unspecified: Secondary | ICD-10-CM | POA: Insufficient documentation

## 2012-07-16 DIAGNOSIS — Z79899 Other long term (current) drug therapy: Secondary | ICD-10-CM | POA: Insufficient documentation

## 2012-07-16 DIAGNOSIS — S82002A Unspecified fracture of left patella, initial encounter for closed fracture: Secondary | ICD-10-CM

## 2012-07-16 DIAGNOSIS — Z01812 Encounter for preprocedural laboratory examination: Secondary | ICD-10-CM | POA: Insufficient documentation

## 2012-07-16 DIAGNOSIS — S76119A Strain of unspecified quadriceps muscle, fascia and tendon, initial encounter: Secondary | ICD-10-CM

## 2012-07-16 DIAGNOSIS — S86819A Strain of other muscle(s) and tendon(s) at lower leg level, unspecified leg, initial encounter: Secondary | ICD-10-CM | POA: Insufficient documentation

## 2012-07-16 DIAGNOSIS — M2419 Other articular cartilage disorders, other specified site: Principal | ICD-10-CM | POA: Insufficient documentation

## 2012-07-16 HISTORY — PX: QUADRICEPS TENDON REPAIR: SHX756

## 2012-07-16 LAB — SURGICAL PCR SCREEN: MRSA, PCR: NEGATIVE

## 2012-07-16 LAB — BASIC METABOLIC PANEL
CO2: 22 mEq/L (ref 19–32)
Calcium: 9 mg/dL (ref 8.4–10.5)
Creatinine, Ser: 0.75 mg/dL (ref 0.50–1.35)
GFR calc non Af Amer: >90 mL/min (ref >90)
Sodium: 138 mEq/L (ref 135–145)

## 2012-07-16 LAB — CBC WITH DIFFERENTIAL/PLATELET
Basophils Absolute: 0.1 10*3/uL (ref 0.0–0.1)
Eosinophils Absolute: 0.1 10*3/uL (ref 0.0–0.7)
Eosinophils Relative: 1 % (ref 0–5)
HCT: 45.2 % (ref 39.0–52.0)
Lymphocytes Relative: 17 % (ref 12–46)
MCH: 33.3 pg (ref 26.0–34.0)
MCHC: 35 g/dL (ref 30.0–36.0)
MCV: 95.2 fL (ref 78.0–100.0)
Monocytes Absolute: 1 10*3/uL (ref 0.1–1.0)
Platelets: 260 10*3/uL (ref 150–400)
RDW: 13.3 % (ref 11.5–15.5)
WBC: 9.8 10*3/uL (ref 4.0–10.5)

## 2012-07-16 SURGERY — REPAIR, TENDON, QUADRICEPS
Anesthesia: General | Site: Knee | Laterality: Right | Wound class: Clean

## 2012-07-16 MED ORDER — CEFAZOLIN SODIUM-DEXTROSE 2-3 GM-% IV SOLR
INTRAVENOUS | Status: AC
  Filled 2012-07-16: qty 50

## 2012-07-16 MED ORDER — SODIUM CHLORIDE 0.9 % IV SOLN
INTRAVENOUS | Status: DC
  Administered 2012-07-16: 19:00:00 via INTRAVENOUS

## 2012-07-16 MED ORDER — OXYCODONE HCL 5 MG PO TABS
5.0000 mg | ORAL_TABLET | ORAL | Status: DC | PRN
  Administered 2012-07-16: 5 mg via ORAL
  Administered 2012-07-17 (×3): 10 mg via ORAL
  Filled 2012-07-16 (×4): qty 2

## 2012-07-16 MED ORDER — HYDROMORPHONE HCL PF 1 MG/ML IJ SOLN
INTRAMUSCULAR | Status: AC
  Filled 2012-07-16: qty 1

## 2012-07-16 MED ORDER — BUPIVACAINE HCL (PF) 0.5 % IJ SOLN
INTRAMUSCULAR | Status: DC | PRN
  Administered 2012-07-16: 30 mL

## 2012-07-16 MED ORDER — METHOCARBAMOL 500 MG PO TABS: 500.0000 mg | ORAL_TABLET | Freq: Four times a day (QID) | ORAL | Status: DC | PRN

## 2012-07-16 MED ORDER — 0.9 % SODIUM CHLORIDE (POUR BTL) OPTIME
TOPICAL | Status: DC | PRN
  Administered 2012-07-16: 1000 mL

## 2012-07-16 MED ORDER — BACITRACIN ZINC 500 UNIT/GM EX OINT
TOPICAL_OINTMENT | CUTANEOUS | Status: AC
  Filled 2012-07-16: qty 15

## 2012-07-16 MED ORDER — ACETAMINOPHEN 500 MG PO TABS
1000.0000 mg | ORAL_TABLET | Freq: Four times a day (QID) | ORAL | Status: AC
  Administered 2012-07-16 – 2012-07-17 (×4): 1000 mg via ORAL
  Filled 2012-07-16 (×3): qty 2

## 2012-07-16 MED ORDER — HYDROMORPHONE HCL PF 1 MG/ML IJ SOLN: 1.0000 mg | INTRAMUSCULAR | Status: DC | PRN

## 2012-07-16 MED ORDER — ONDANSETRON HCL 4 MG/2ML IJ SOLN
4.0000 mg | Freq: Once | INTRAMUSCULAR | Status: AC
  Administered 2012-07-16: 4 mg via INTRAVENOUS
  Filled 2012-07-16: qty 2

## 2012-07-16 MED ORDER — LIDOCAINE HCL (CARDIAC) 20 MG/ML IV SOLN
INTRAVENOUS | Status: DC | PRN
  Administered 2012-07-16: 50 mg via INTRAVENOUS

## 2012-07-16 MED ORDER — LACTATED RINGERS IV SOLN: INTRAVENOUS | Status: DC

## 2012-07-16 MED ORDER — MIDAZOLAM HCL 5 MG/5ML IJ SOLN
INTRAMUSCULAR | Status: DC | PRN
  Administered 2012-07-16: 2 mg via INTRAVENOUS

## 2012-07-16 MED ORDER — ASPIRIN EC 325 MG PO TBEC
325.0000 mg | DELAYED_RELEASE_TABLET | Freq: Every day | ORAL | Status: DC
  Administered 2012-07-16 – 2012-07-17 (×2): 325 mg via ORAL
  Filled 2012-07-16 (×2): qty 1

## 2012-07-16 MED ORDER — SODIUM CHLORIDE 0.9 % IV SOLN
INTRAVENOUS | Status: DC
  Administered 2012-07-16: 15:00:00 via INTRAVENOUS

## 2012-07-16 MED ORDER — FENTANYL CITRATE 0.05 MG/ML IJ SOLN
INTRAMUSCULAR | Status: DC | PRN
  Administered 2012-07-16: 100 ug via INTRAVENOUS
  Administered 2012-07-16 (×3): 50 ug via INTRAVENOUS

## 2012-07-16 MED ORDER — METHOCARBAMOL 100 MG/ML IJ SOLN
500.0000 mg | Freq: Four times a day (QID) | INTRAVENOUS | Status: DC | PRN
  Administered 2012-07-16: 500 mg via INTRAVENOUS
  Filled 2012-07-16: qty 5

## 2012-07-16 MED ORDER — BACITRACIN ZINC 500 UNIT/GM EX OINT
TOPICAL_OINTMENT | CUTANEOUS | Status: DC | PRN
  Administered 2012-07-16: 1 via TOPICAL

## 2012-07-16 MED ORDER — CEFAZOLIN SODIUM-DEXTROSE 2-3 GM-% IV SOLR
2.0000 g | INTRAVENOUS | Status: AC
  Administered 2012-07-16: 2 g via INTRAVENOUS
  Filled 2012-07-16: qty 50

## 2012-07-16 MED ORDER — SODIUM CHLORIDE 0.9 % IV SOLN
Freq: Once | INTRAVENOUS | Status: AC
  Administered 2012-07-16: 13:00:00 via INTRAVENOUS

## 2012-07-16 MED ORDER — LACTATED RINGERS IV SOLN: INTRAVENOUS | Status: DC | PRN

## 2012-07-16 MED ORDER — CHLORHEXIDINE GLUCONATE 4 % EX LIQD
60.0000 mL | Freq: Once | CUTANEOUS | Status: DC
  Filled 2012-07-16: qty 60

## 2012-07-16 MED ORDER — ACETAMINOPHEN 10 MG/ML IV SOLN
INTRAVENOUS | Status: DC | PRN
  Administered 2012-07-16: 1000 mg via INTRAVENOUS

## 2012-07-16 MED ORDER — PROMETHAZINE HCL 25 MG/ML IJ SOLN: 6.2500 mg | INTRAMUSCULAR | Status: DC | PRN

## 2012-07-16 MED ORDER — MORPHINE SULFATE 4 MG/ML IJ SOLN
4.0000 mg | Freq: Once | INTRAMUSCULAR | Status: AC
  Administered 2012-07-16: 4 mg via INTRAVENOUS
  Filled 2012-07-16: qty 1

## 2012-07-16 MED ORDER — HYDROMORPHONE HCL PF 1 MG/ML IJ SOLN
0.2500 mg | INTRAMUSCULAR | Status: DC | PRN
Start: 2012-07-16 — End: 2012-07-16
  Administered 2012-07-16 (×4): 0.5 mg via INTRAVENOUS

## 2012-07-16 MED ORDER — DOCUSATE SODIUM 100 MG PO CAPS
100.0000 mg | ORAL_CAPSULE | Freq: Two times a day (BID) | ORAL | Status: DC
  Administered 2012-07-16 – 2012-07-17 (×2): 100 mg via ORAL
  Filled 2012-07-16 (×3): qty 1

## 2012-07-16 MED ORDER — LACTATED RINGERS IV SOLN
INTRAVENOUS | Status: DC | PRN
  Administered 2012-07-16: 17:00:00 via INTRAVENOUS

## 2012-07-16 MED ORDER — METOCLOPRAMIDE HCL 5 MG/ML IJ SOLN: 5.0000 mg | Freq: Three times a day (TID) | INTRAMUSCULAR | Status: DC | PRN

## 2012-07-16 MED ORDER — PROPOFOL 10 MG/ML IV BOLUS
INTRAVENOUS | Status: DC | PRN
  Administered 2012-07-16: 180 mg via INTRAVENOUS

## 2012-07-16 MED ORDER — SUCCINYLCHOLINE CHLORIDE 20 MG/ML IJ SOLN
INTRAMUSCULAR | Status: DC | PRN
  Administered 2012-07-16: 100 mg via INTRAVENOUS

## 2012-07-16 MED ORDER — METOCLOPRAMIDE HCL 10 MG PO TABS: 5.0000 mg | ORAL_TABLET | Freq: Three times a day (TID) | ORAL | Status: DC | PRN

## 2012-07-16 MED ORDER — ONDANSETRON HCL 4 MG/2ML IJ SOLN: 4.0000 mg | Freq: Four times a day (QID) | INTRAMUSCULAR | Status: DC | PRN

## 2012-07-16 MED ORDER — ONDANSETRON HCL 4 MG PO TABS: 4.0000 mg | ORAL_TABLET | Freq: Four times a day (QID) | ORAL | Status: DC | PRN

## 2012-07-16 MED ORDER — BUPIVACAINE HCL (PF) 0.5 % IJ SOLN
INTRAMUSCULAR | Status: AC
  Filled 2012-07-16: qty 30

## 2012-07-16 MED ORDER — VITAMIN C 500 MG PO TABS
500.0000 mg | ORAL_TABLET | Freq: Every day | ORAL | Status: DC
  Administered 2012-07-17: 500 mg via ORAL
  Filled 2012-07-16: qty 1

## 2012-07-16 MED ORDER — SENNA 8.6 MG PO TABS
2.0000 | ORAL_TABLET | Freq: Two times a day (BID) | ORAL | Status: DC
  Administered 2012-07-16 – 2012-07-17 (×2): 17.2 mg via ORAL
  Filled 2012-07-16 (×2): qty 2

## 2012-07-16 MED ORDER — ENOXAPARIN SODIUM 40 MG/0.4ML ~~LOC~~ SOLN
40.0000 mg | SUBCUTANEOUS | Status: DC
  Administered 2012-07-17: 40 mg via SUBCUTANEOUS
  Filled 2012-07-16 (×2): qty 0.4

## 2012-07-16 MED ORDER — ACETAMINOPHEN 10 MG/ML IV SOLN
INTRAVENOUS | Status: AC
  Filled 2012-07-16: qty 100

## 2012-07-16 SURGICAL SUPPLY — 47 items
BAG SPEC THK2 15X12 ZIP CLS (MISCELLANEOUS) ×1
BAG ZIPLOCK 12X15 (MISCELLANEOUS) ×1 IMPLANT
BANDAGE ELASTIC 6 VELCRO ST LF (GAUZE/BANDAGES/DRESSINGS) ×1 IMPLANT
BANDAGE ESMARK 6X9 LF (GAUZE/BANDAGES/DRESSINGS) IMPLANT
BIT DRILL CANN 2.7X625 NONSTRL (BIT) ×1 IMPLANT
BNDG CMPR 9X6 STRL LF SNTH (GAUZE/BANDAGES/DRESSINGS) ×1
BNDG ESMARK 6X9 LF (GAUZE/BANDAGES/DRESSINGS) ×2
CHLORAPREP W/TINT 26ML (MISCELLANEOUS) ×1 IMPLANT
CLOTH BEACON ORANGE TIMEOUT ST (SAFETY) ×1 IMPLANT
DRAPE C-ARM 42X72 X-RAY (DRAPES) ×1 IMPLANT
DRAPE POUCH INSTRU U-SHP 10X18 (DRAPES) ×1 IMPLANT
DRAPE U-SHAPE 47X51 STRL (DRAPES) ×1 IMPLANT
DRSG ADAPTIC 3X8 NADH LF (GAUZE/BANDAGES/DRESSINGS) ×1 IMPLANT
DRSG PAD ABDOMINAL 8X10 ST (GAUZE/BANDAGES/DRESSINGS) ×1 IMPLANT
ELECT REM PT RETURN 9FT ADLT (ELECTROSURGICAL) ×2
ELECTRODE REM PT RTRN 9FT ADLT (ELECTROSURGICAL) IMPLANT
GLOVE BIO SURGEON STRL SZ8 (GLOVE) ×1 IMPLANT
GLOVE BIOGEL PI IND STRL 8 (GLOVE) IMPLANT
GLOVE BIOGEL PI INDICATOR 8 (GLOVE) ×1
GOWN PREVENTION PLUS XXLARGE (GOWN DISPOSABLE) ×1 IMPLANT
GOWN STRL REIN XL XLG (GOWN DISPOSABLE) ×2 IMPLANT
IMMOBILIZER KNEE 20 (SOFTGOODS) ×2
IMMOBILIZER KNEE 20 THIGH 36 (SOFTGOODS) IMPLANT
K-WIRE ACE 1.6X6 (WIRE) ×6
KIT BASIN OR (CUSTOM PROCEDURE TRAY) ×1 IMPLANT
KWIRE ACE 1.6X6 (WIRE) IMPLANT
MANIFOLD NEPTUNE II (INSTRUMENTS) ×1 IMPLANT
NEEDLE HYPO 22GX1.5 SAFETY (NEEDLE) ×1 IMPLANT
NS IRRIG 1000ML POUR BTL (IV SOLUTION) ×1 IMPLANT
PACK GENERAL/GYN (CUSTOM PROCEDURE TRAY) ×1 IMPLANT
PADDING CAST COTTON 6X4 STRL (CAST SUPPLIES) ×1 IMPLANT
PASSER SUT SWANSON 36MM LOOP (INSTRUMENTS) ×1 IMPLANT
POSITIONER SURGICAL ARM (MISCELLANEOUS) ×1 IMPLANT
RETRIEVER SUT HEWSON (MISCELLANEOUS) ×1 IMPLANT
SPONGE GAUZE 4X4 12PLY (GAUZE/BANDAGES/DRESSINGS) ×1 IMPLANT
SUT ETHIBOND 2 OS 4 DA (SUTURE) ×1 IMPLANT
SUT FIBERWIRE #2 38 T-5 BLUE (SUTURE) ×4
SUT MNCRL AB 3-0 PS2 18 (SUTURE) ×2 IMPLANT
SUT MNCRL AB 4-0 PS2 18 (SUTURE) IMPLANT
SUT VIC AB 0 CT1 27 (SUTURE) ×4
SUT VIC AB 0 CT1 27XBRD ANTBC (SUTURE) IMPLANT
SUT VIC AB 2-0 CT1 27 (SUTURE) ×2
SUT VIC AB 2-0 CT1 TAPERPNT 27 (SUTURE) IMPLANT
SUTURE FIBERWR #2 38 T-5 BLUE (SUTURE) IMPLANT
SYRINGE 10CC LL (SYRINGE) ×1 IMPLANT
TOWEL OR 17X26 10 PK STRL BLUE (TOWEL DISPOSABLE) ×2 IMPLANT
YANKAUER SUCT BULB TIP NO VENT (SUCTIONS) ×1 IMPLANT

## 2012-07-16 NOTE — Progress Notes (Signed)
[facility] nursing:  Ortho tech notified [employer] of brace need.  They have information and will deliver and fit patient tonight.  Plan per surgeon is dc home tomorrow

## 2012-07-16 NOTE — Anesthesia Preprocedure Evaluation (Addendum)
Anesthesia Evaluation  Patient identified by MRN, date of birth, ID band Patient awake    Reviewed: Allergy & Precautions, H&P , NPO status , Patient's Chart, lab work & pertinent test results  History of Anesthesia Complications Negative for: history of anesthetic complications  Airway Mallampati: I TM Distance: >3 FB Neck ROM: Full    Dental No notable dental hx. (+) Teeth Intact, Dental Advisory Given and Caps   Pulmonary former smoker,  breath sounds clear to auscultation  Pulmonary exam normal       Cardiovascular negative cardio ROS  Rhythm:Regular Rate:Normal     Neuro/Psych Back pain, radiates to L leg    GI/Hepatic negative GI ROS, Neg liver ROS,   Endo/Other  negative endocrine ROS  Renal/GU negative Renal ROS  negative genitourinary   Musculoskeletal negative musculoskeletal ROS (+)   Abdominal (+) + obese,   Peds  Hematology negative hematology ROS (+)   Anesthesia Other Findings   Reproductive/Obstetrics                          Anesthesia Physical Anesthesia Plan  ASA: I and emergent  Anesthesia Plan: General   Post-op Pain Management:    Induction: Intravenous  Airway Management Planned: Oral ETT  Additional Equipment:   Intra-op Plan:   Post-operative Plan: Extubation in OR  Informed Consent: I have reviewed the patients History and Physical, chart, labs and discussed the procedure including the risks, benefits and alternatives for the proposed anesthesia with the patient or authorized representative who has indicated his/her understanding and acceptance.   Dental advisory given  Plan Discussed with: CRNA  Anesthesia Plan Comments:         Anesthesia Quick Evaluation

## 2012-07-16 NOTE — Brief Op Note (Signed)
07/16/2012  6:02 PM  PATIENT:  Roger Wilcox  61 y.o. male  PRE-OPERATIVE DIAGNOSIS:  Right quadriceps tendon rupture  POST-OPERATIVE DIAGNOSIS:  Right quadriceps tendon rupture (avulsion from superior patella)  Procedure(s): 1.  Repair of right quadriceps tendon rupture 2.  fluoro  SURGEON:  Toni Arthurs, MD  ASSISTANT: n/a  ANESTHESIA:   General  EBL:  minimal   TOURNIQUET:   Total Tourniquet Time Documented: Thigh (Right) - 55 minutes  COMPLICATIONS:  None apparent  DISPOSITION:  Extubated, awake and stable to recovery.  DICTATION ID:  [id number]

## 2012-07-16 NOTE — ED Notes (Signed)
Pt presents w/ c/o of right knee pain after sustaining fall yesterday, sliding down slippery slope on foot and landing on right knee. Iced knee for 24 hrs but continues to pain in joint, unable to bear weight, unable to extend knee, flexion w/o any difficulty. Aleve this a.m. For discomfort.

## 2012-07-16 NOTE — ED Notes (Signed)
PA at bedside.

## 2012-07-16 NOTE — ED Notes (Signed)
Report given to Blanket, rn on floor

## 2012-07-16 NOTE — Anesthesia Postprocedure Evaluation (Signed)
Anesthesia Post Note  Patient: Roger Wilcox  Procedure(s) Performed: Procedure(s) (LRB): REPAIR QUADRICEP TENDON (Right)  Anesthesia type: General  Patient location: PACU  Post pain: Pain level controlled  Post assessment: Post-op Vital signs reviewed  Last Vitals:  Filed Vitals:   07/16/12 1857  BP: 126/84  Pulse: 67  Temp: 36.9 C  Resp: 14    Post vital signs: Reviewed  Level of consciousness: sedated  Complications: No apparent anesthesia complications

## 2012-07-16 NOTE — Transfer of Care (Signed)
Immediate Anesthesia Transfer of Care Note  Patient: Roger Wilcox  Procedure(s) Performed: Procedure(s) (LRB) with comments: REPAIR QUADRICEP TENDON (Right)  Patient Location: PACU  Anesthesia Type:General  Level of Consciousness: awake, alert  and oriented  Airway & Oxygen Therapy: Patient Spontanous Breathing and Patient connected to face mask oxygen  Post-op Assessment: Report given to PACU RN and Post -op Vital signs reviewed and stable  Post vital signs: Reviewed and stable  Complications: No apparent anesthesia complications

## 2012-07-16 NOTE — H&P (Signed)
Roger Wilcox is an 61 y.o. male.   Chief Complaint: right knee pain HPI: 61 y/o male without significant PMH fell in his yard yesterday with a hyperflexion injury to the right knee.  He c/o moderate pain that is dull and aching in the knee at rest but becomes sharper and more intense when moving the knee.  Unable to bear weight.  Says the knee just buckles under him.  No blood thinners.  No h/o diabetes or smoking.    Past Medical History  Diagnosis Date  . Constipation   . Arthritis     lumbar stenosis    Past Surgical History  Procedure Date  . Neck fusion   . Neck fusion 2003  . Neck fusion 1982  . Appendectomy   . Lumbar laminectomy/decompression microdiscectomy 10/11/2011    Procedure: LUMBAR LAMINECTOMY/DECOMPRESSION MICRODISCECTOMY 4 LEVEL;  Surgeon: Clydene Fake, MD;  Location: [facility];  Service: Neurosurgery;  Laterality: N/A;  Lumbar Two to Sacral One Decompressive Laminectomy    Family History  Problem Relation Age of Onset  . Anesthesia problems Neg Hx    Social History:  reports that he quit smoking about 3 years ago. He has never used smokeless tobacco. He reports that he drinks about 1.8 ounces of alcohol per week. He reports that he does not use illicit drugs.  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  Allergies: No Known Allergies   (Not in a hospital admission)  Results for orders placed during the hospital encounter of 07/16/12 (from the past 48 hour(s))  CBC WITH DIFFERENTIAL     Status: Normal   Collection Time   07/16/12 12:47 PM      Component Value Range Comment   WBC 9.8  4.0 - 10.5 K/uL    RBC 4.75  4.22 - 5.81 MIL/uL    Hemoglobin 15.8  13.0 - 17.0 g/dL    HCT 78.2  95.6 - 21.3 %    MCV 95.2  78.0 - 100.0 fL    MCH 33.3  26.0 - 34.0 pg    MCHC 35.0  30.0 - 36.0 g/dL    RDW 08.6  57.8 - 46.9 %    Platelets 260  150 - 400 K/uL    Neutrophils Relative 72  43 - 77 %    Neutro Abs 7.0  1.7 - 7.7 K/uL    Lymphocytes Relative 17  12 - 46 %     Lymphs Abs 1.6  0.7 - 4.0 K/uL    Monocytes Relative 11  3 - 12 %    Monocytes Absolute 1.0  0.1 - 1.0 K/uL    Eosinophils Relative 1  0 - 5 %    Eosinophils Absolute 0.1  0.0 - 0.7 K/uL    Basophils Relative 1  0 - 1 %    Basophils Absolute 0.1  0.0 - 0.1 K/uL   BASIC METABOLIC PANEL     Status: Abnormal   Collection Time   07/16/12 12:47 PM      Component Value Range Comment   Sodium 138  135 - 145 mEq/L    Potassium 4.3  3.5 - 5.1 mEq/L    Chloride 105  96 - 112 mEq/L    CO2 22  19 - 32 mEq/L    Glucose, Bld 68 (*) 70 - 99 mg/dL    BUN 23  6 - 23 mg/dL    Creatinine, Ser 6.29  0.50 - 1.35 mg/dL    Calcium  9.0  8.4 - 10.5 mg/dL    GFR calc non Af Amer >90  >90 mL/min    GFR calc Af Amer >90  >90 mL/min    Dg Knee Complete 4 Views Right  2012-07-21  *RADIOLOGY REPORT*  Clinical Data: Knee injury  RIGHT KNEE - COMPLETE 4+ VIEW  Comparison: None.  Findings: There is irregularity of the superior cortex of the patella, and there are a few displaced bony fragments superior to the patella, the largest of which is 19 x 8 mm in size, measured on the lateral projection.  Findings are consistent with an avulsion fracture of the superior pole of the patella.  There is a joint effusion.  The medial and lateral compartments of the knee are preserved.  Proximal femur, proximal tibia, and proximal fibula appear intact.  IMPRESSION: 1.  Avulsion fracture of the superior pole of the patella. Findings are suspicious for a quadriceps tendon rupture. 2.  Joint effusion.   Original Report Authenticated By: Britta Mccreedy, M.D.     ROS  No recent f/c/n/v/wt loss.  Blood pressure 118/80, pulse 66, temperature 98.6 F (37 C), temperature source Oral, resp. rate 14, SpO2 95.00%. Physical Exam  wn wd male in nad.  A and O x 4.  Mood and affect normal.  EOMI.  Respirations unlabored.  R knee swollen but skin healthy and intact.  Palpable defect at quad tendon insertino on the proximal patellar pole.  2/5  strength in knee extension but unable to hold extended.  TTP at proximal pole.  Sens to LT intact across anterior leg.  2+ dp and pt pulses.  Assessment/Plan R knee quad tendon avulsion from superior pole of patella.  To OR for quad tendon repair.  The risks and benefits of the alternative treatment options have been discussed in detail.  The patient wishes to proceed with surgery and specifically understands risks of bleeding, infection, nerve damage, blood clots, need for additional surgery, amputation and death.   Roger, Wilcox 07-21-2012, 2:15 PM

## 2012-07-16 NOTE — ED Provider Notes (Signed)
Medical screening examination/treatment/procedure(s) were performed by non-physician practitioner and as supervising physician I was immediately available for consultation/collaboration.  Jaila Schellhorn R. Natalee Tomkiewicz, MD 07/16/12 1545 

## 2012-07-16 NOTE — ED Notes (Signed)
Pt alert and oriented x4. Respirations even and unlabored, bilateral symmetrical rise and fall of chest. Skin warm and dry. In no acute distress. Denies needs.   

## 2012-07-16 NOTE — ED Provider Notes (Signed)
History     CSN: [id number]  Arrival date & time 07/16/12  1105   First MD Initiated Contact with Patient 07/16/12 1130      Chief Complaint  Patient presents with  . Knee Injury    (Consider location/radiation/quality/duration/timing/severity/associated sxs/prior treatment) HPI  61 year old male with history of arthritis presents for evaluations of right knee injury. Patient states he was walking down a hill yesterday, slipped on wet grass, and fell backward. He noticed that his right knee where bent outward and experienced sharp throbbing pain to the inner aspect of his knee.  Unable to bear weight afterward.  Onset is acute, sharp, non radiating, worsening with weight bearing and improves with rest.  Denies hip or ankle pain.  Denies any significant swelling.  Has tried ice and aleve with minimal relief.  Also report difficulty straightening his R knee.  No prior injury to same knee.    Past Medical History  Diagnosis Date  . Constipation   . Arthritis     lumbar stenosis    Past Surgical History  Procedure Date  . Neck fusion   . Neck fusion 2003  . Neck fusion 1982  . Appendectomy   . Lumbar laminectomy/decompression microdiscectomy 10/11/2011    Procedure: LUMBAR LAMINECTOMY/DECOMPRESSION MICRODISCECTOMY 4 LEVEL;  Surgeon: Clydene Fake, MD;  Location: [facility];  Service: Neurosurgery;  Laterality: N/A;  Lumbar Two to Sacral One Decompressive Laminectomy    Family History  Problem Relation Age of Onset  . Anesthesia problems Neg Hx     History  Substance Use Topics  . Smoking status: Former Smoker -- 20 years    Quit date: 09/21/2008  . Smokeless tobacco: Never Used  . Alcohol Use: 1.8 oz/week    3 Cans of beer per week      Review of Systems  Constitutional: Negative for fever.  Musculoskeletal: Positive for joint swelling, arthralgias and gait problem. Negative for back pain.  Skin: Negative for rash and wound.  Neurological: Negative for numbness.      Allergies  Review of patient's allergies indicates no known allergies.  Home Medications   Current Outpatient Rx  Name  Route  Sig  Dispense  Refill  . DICLOFENAC SODIUM 75 MG PO TBEC   Oral   Take 75 mg by mouth 2 (two) times daily.         . OXYCODONE-ACETAMINOPHEN 5-500 MG PO TABS   Oral   Take 1 tablet by mouth every 4 (four) hours as needed.         Marland Kitchen VITAMIN C 500 MG PO TABS   Oral   Take 500 mg by mouth daily.           BP 138/80  Pulse 92  Temp 98.6 F (37 C) (Oral)  Resp 14  SpO2 98%  Physical Exam  Nursing note and vitals reviewed. Constitutional: He is oriented to person, place, and time. He appears well-developed and well-nourished. No distress.  HENT:  Head: Normocephalic and atraumatic.  Eyes: Conjunctivae normal are normal.  Neck: Neck supple.  Musculoskeletal: He exhibits tenderness (R knee: point tenderness to superior and medial aspect of knee on palpation, no obvious swelling or overlying skin changes.  negative anterior/posterior drawer test, normal varus/valgus maneuver.   No joint laxity. Unable to extend lower leg ).  Neurological: He is alert and oriented to person, place, and time.  Skin: Skin is warm. No rash noted.  Psychiatric: He has a normal mood and affect.  ED Course  Procedures (including critical care time)  Results for orders placed during the hospital encounter of 07/16/12  CBC WITH DIFFERENTIAL      Component Value Range   WBC 9.8  4.0 - 10.5 K/uL   RBC 4.75  4.22 - 5.81 MIL/uL   Hemoglobin 15.8  13.0 - 17.0 g/dL   HCT 40.9  81.1 - 91.4 %   MCV 95.2  78.0 - 100.0 fL   MCH 33.3  26.0 - 34.0 pg   MCHC 35.0  30.0 - 36.0 g/dL   RDW 78.2  95.6 - 21.3 %   Platelets 260  150 - 400 K/uL   Neutrophils Relative 72  43 - 77 %   Neutro Abs 7.0  1.7 - 7.7 K/uL   Lymphocytes Relative 17  12 - 46 %   Lymphs Abs 1.6  0.7 - 4.0 K/uL   Monocytes Relative 11  3 - 12 %   Monocytes Absolute 1.0  0.1 - 1.0 K/uL   Eosinophils  Relative 1  0 - 5 %   Eosinophils Absolute 0.1  0.0 - 0.7 K/uL   Basophils Relative 1  0 - 1 %   Basophils Absolute 0.1  0.0 - 0.1 K/uL  BASIC METABOLIC PANEL      Component Value Range   Sodium 138  135 - 145 mEq/L   Potassium 4.3  3.5 - 5.1 mEq/L   Chloride 105  96 - 112 mEq/L   CO2 22  19 - 32 mEq/L   Glucose, Bld 68 (*) 70 - 99 mg/dL   BUN 23  6 - 23 mg/dL   Creatinine, Ser 0.86  0.50 - 1.35 mg/dL   Calcium 9.0  8.4 - 57.8 mg/dL   GFR calc non Af Amer >90  >90 mL/min   GFR calc Af Amer >90  >90 mL/min   Dg Knee Complete 4 Views Right  07/16/2012  *RADIOLOGY REPORT*  Clinical Data: Knee injury  RIGHT KNEE - COMPLETE 4+ VIEW  Comparison: None.  Findings: There is irregularity of the superior cortex of the patella, and there are a few displaced bony fragments superior to the patella, the largest of which is 19 x 8 mm in size, measured on the lateral projection.  Findings are consistent with an avulsion fracture of the superior pole of the patella.  There is a joint effusion.  The medial and lateral compartments of the knee are preserved.  Proximal femur, proximal tibia, and proximal fibula appear intact.  IMPRESSION: 1.  Avulsion fracture of the superior pole of the patella. Findings are suspicious for a quadriceps tendon rupture. 2.  Joint effusion.   Original Report Authenticated By: Britta Mccreedy, M.D.      1. Left patella avulsion fracture 2. Ruptured of L quadriceps tendon  MDM  R knee injury when slipped on wet grass yesterday afternoon.  Xray ordered. Pain medication offered, pt declined.    12:24 PM Xray shows avulsion fx of posterior aspect of patella, with suspected quadricep tendon ruptured.  Pt unable to to extend knee.  I have consulted with orthopedist, Dr. Victorino Dike, who request pt to be NPO and will see pt in ED.  Pt aware of plan.  Care discussed with my attending.    2:49 PM Dr. Victorino Dike has evaluated pt and will admit for surgery this afternoon.  Pt is aware of plan.     BP 118/80  Pulse 66  Temp 98.6 F (37 C) (Oral)  Resp 14  SpO2 95%  I have reviewed nursing notes and vital signs. I personally reviewed the imaging tests through PACS system  I reviewed available ER/hospitalization records thought the EMR   Fayrene Helper, PA-C 07/16/12 1450

## 2012-07-17 ENCOUNTER — Encounter (HOSPITAL_COMMUNITY): Payer: Self-pay | Admitting: Orthopedic Surgery

## 2012-07-17 MED ORDER — OXYCODONE HCL 5 MG PO TABS: 5.0000 mg | ORAL_TABLET | ORAL | Status: DC | PRN

## 2012-07-17 MED ORDER — ASPIRIN 325 MG PO TBEC: 325.0000 mg | DELAYED_RELEASE_TABLET | Freq: Every day | ORAL | Status: AC

## 2012-07-17 MED ORDER — SENNA 8.6 MG PO TABS: 2.0000 | ORAL_TABLET | Freq: Two times a day (BID) | ORAL | Status: DC

## 2012-07-17 MED ORDER — DSS 100 MG PO CAPS: 100.0000 mg | ORAL_CAPSULE | Freq: Two times a day (BID) | ORAL | Status: DC

## 2012-07-17 NOTE — Op Note (Signed)
NAME:  SANCHO, GEIMER NO.:  [id number]  MEDICAL RECORD NO.:  [id number]  LOCATION:  1507                         FACILITY:  [facility]  PHYSICIAN:  Toni Arthurs, MD        DATE OF BIRTH:  03/19/1951  DATE OF PROCEDURE:  07/16/2012 DATE OF DISCHARGE:                              OPERATIVE REPORT   PREOPERATIVE DIAGNOSIS:  Right quadriceps tendon rupture.  POSTOPERATIVE DIAGNOSIS:  Right quadriceps tendon rupture (avulsion from the superior pole of the patella).  PROCEDURE: 1. Repair of right quadriceps tendon rupture. 2. Intraoperative interpretation of fluoroscopic imaging.  SURGEON:  Toni Arthurs, MD  ANESTHESIA:  General.  ESTIMATED BLOOD LOSS:  Minimal.  TOURNIQUET TIME:  55 minutes at 250 mmHg.  COMPLICATIONS:  None apparent.  DISPOSITION:  Extubated awake and stable to recovery.  INDICATIONS FOR PROCEDURE:  The patient is a 61 year old male who fell on the yard yesterday, hyperflexing his right knee.  He could not bear weight afterwards and presented to the emergency room today where x-rays revealed an avulsion from the superior pole of the patella.  He was unable to extend his knee against resistance.  He presents now for operative treatment of quadriceps tendon evulsion.  He understands the risks and benefits of the alternative treatment options and elects surgical treatment.  He specifically understands risks of bleeding, infection, nerve damage, blood clots, need for additional surgery, amputation, and death.  PROCEDURE IN DETAIL:  After preoperative consent was obtained, the correct operative site was identified.  The patient was brought to the operating room and placed supine on the operating table.  General anesthesia was induced.  Preoperative antibiotics were administered. Surgical time-out was taken.  The right lower extremity was prepped and draped in standard sterile fashion with the tourniquet around the thigh. The extremity was  elevated for approximately a minute.  Tourniquet was inflated to 225 mmHg.  A longitudinal incision was made over the distal quad tendon and the patella.  Sharp dissection was carried down through the skin and subcutaneous tissue in the extensor retinaculum. Immediately, evident was quadriceps tendon avulsed from the superior pole of the patella as well as disruption of the retinaculum medially and laterally.  The wound was irrigated copiously.  A rongeur was used to remove the cortical bone from the superior pole of the patella exposing healthy medullary bone.  The fibrinous tissue proximally was all removed sharply as well.  Bunnell sutures of #2 FiberWire were placed in the quadriceps tendon, creating 2 pairs of sutures coming out of the distal end of the tendon.  A K-wire was then inserted into the inferior pole of the patella and then drilled longitudinally out through the superior pole of the patella.  AP and lateral fluoroscopic images confirmed appropriate position of this guidewire.  Two more guidewires were inserted parallel to the first dividing the patella roughly into orders.  All 3 K-wires were then over drilled with a 2.7 mm drill bit. A suture passer was then used to pull the center 2 sutures down through the central hole and the medial and lateral sutures down through the medial and lateral holes respectively.  The  tendon was then pulled tight against the superior part of patella.  The sutures were pulled through the fiber of the tendon such that the knots were within the cleared area anteriorly inferiorly.  Sutures were repaired to each other, pair wise. The paired sutures were then tied to each other in the central cleared area.  The extensor retinaculum was then repaired with imbricating sutures of 0 Vicryl.  The knee could be flexed approximately 45 degrees without gapping of the sutures prior to repairing the retinaculum.  The superficial aspect of the extensor  retinaculum was then repaired with inverted simple sutures of 0 Vicryl.  Subcutaneous tissue was approximated with inverted simple sutures of 3-0 Monocryl and a running 2-0 nylon was used to close the skin incision.  Sterile dressings were applied followed by a compression wrap and a knee immobilizer.  The tourniquet was released at 55 minutes after application of the dressings.  The patient was then awakened from anesthesia and transported to the recovery room in stable condition.  FOLLOWUP PLAN:  The patient will be weightbearing as tolerated in a hinged range-of-motion brace marked at 0 degrees.  He will have physical therapy for ambulation and likely be discharged home tomorrow.  He will be observed overnight for pain control.     Toni Arthurs, MD     JH/MEDQ  D:  07/16/2012  T:  07/17/2012  Job:  [id number]

## 2012-07-17 NOTE — Progress Notes (Signed)
Pt is progressing well, Brace placed on knee,  patient has tolerated PO pain meds and pain is at a minimum.   Pt is tolerating clears well, and has slept comfortably during the night.

## 2012-07-17 NOTE — Discharge Summary (Signed)
Physician Discharge Summary  Patient ID: Roger Wilcox MRN: [id number] DOB/AGE: Sep 04, 1950 61 y.o.  Admit date: 07/16/2012 Discharge date: 07/17/2012  Admission Diagnoses: H/o spine surgery Right quad tendon rupture  Discharge Diagnoses:  Same s/p right quad tendon repair  Discharged Condition: stable  Hospital Course: Pt was admitted on 12/1 for his quad tendon rupture.  He was taken to surgery that day for repair of the quad tendon.  He tolerated the procedure well.  He did well with PT and is discharged to home in stable condition.  Consults: None  Significant Diagnostic Studies: none  Treatments: surgery: repair of right quad tendon  Discharge Exam: Blood pressure 115/65, pulse 79, temperature 98.7 F (37.1 C), temperature source Oral, resp. rate 17, height 5' 10.5" (1.791 m), weight 91.2 kg (201 lb 1 oz), SpO2 97.00%. Incision/Wound:  Dressed and dry.  NVI R LE.  Disposition: 01-Home or Self Care  Discharge Orders    Future Orders Please Complete By Expires   Diet - low sodium heart healthy      Call MD / Call 911      Comments:   If you experience chest pain or shortness of breath, CALL 911 and be transported to the hospital emergency room.  If you develope a fever above 101 F, pus (white drainage) or increased drainage or redness at the wound, or calf pain, call your surgeon's office.   Constipation Prevention      Comments:   Drink plenty of fluids.  Prune juice may be helpful.  You may use a stool softener, such as Colace (over the counter) 100 mg twice a day.  Use MiraLax (over the counter) for constipation as needed.   Increase activity slowly as tolerated          Medication List     As of 07/17/2012  1:46 PM    STOP taking these medications         oxycodone-acetaminophen 5-500 MG per tablet   Commonly known as: ROXICET      TAKE these medications         aspirin 325 MG EC tablet   Take 1 tablet (325 mg total) by mouth daily.      diclofenac 75 MG EC  tablet   Commonly known as: VOLTAREN   Take 75 mg by mouth 2 (two) times daily.      DSS 100 MG Caps   Take 100 mg by mouth 2 (two) times daily.      naproxen sodium 220 MG tablet   Commonly known as: ANAPROX   Take 440 mg by mouth 2 (two) times daily as needed. For pain      oxyCODONE 5 MG immediate release tablet   Commonly known as: Oxy IR/ROXICODONE   Take 1-2 tablets (5-10 mg total) by mouth every 4 (four) hours as needed for pain.      senna 8.6 MG Tabs   Commonly known as: SENOKOT   Take 2 tablets (17.2 mg total) by mouth 2 (two) times daily.      vitamin C 500 MG tablet   Commonly known as: ASCORBIC ACID   Take 500 mg by mouth daily.           Follow-up Information    Follow up with Treyshon Buchanon, Jonny Ruiz, MD. Schedule an appointment as soon as possible for a visit in 2 weeks.   Contact information:   [address], [address] [place] [address] [phone]  SignedAKILI, CORSETTI 07/17/2012, 1:46 PM

## 2012-07-17 NOTE — Evaluation (Signed)
Physical Therapy Evaluation Patient Details Name: Roger Wilcox MRN: [id number] DOB: [birth year] Today's Date: 07/17/2012 Time: 0900-0940 PT Time Calculation (min): 40 min  PT Assessment / Plan / Recommendation Clinical Impression  61 yo male s/p R quad tendon repair on 06/16/12. Mobilizing fairly well. Min-Mod assist for stair negotiation with 1 crutch, 1 rail. Min-guard assist for ambulation with RW. Pt states he has a set of crutches at home. Instructed pt on proper crutch fit. Recommend use of walker for ambulation for increased stability and safety. Recommend HHPT for continued gait and stair training. Discussed possble need for pt to stay on 1st level if able. Also recommend RW. Pt declined 3n1.     PT Assessment  Patient needs continued PT services    Follow Up Recommendations  Home health PT;Supervision for mobility/OOB    Does the patient have the potential to tolerate intense rehabilitation      Barriers to Discharge        Equipment Recommendations  Rolling walker with 5" wheels    Recommendations for Other Services OT consult   Frequency Min 4X/week    Precautions / Restrictions Precautions Precautions: Fall Required Braces or Orthoses: Other Brace/Splint Other Brace/Splint: hinged brace locked in extension R LE Restrictions Weight Bearing Restrictions: Yes RLE Weight Bearing: Touchdown weight bearing   Pertinent Vitals/Pain 2/10 at start and end of session- R LE      Mobility  Bed Mobility Bed Mobility: Supine to Sit Supine to Sit: 4: Min assist Details for Bed Mobility Assistance: Assist for R le off bed.  Transfers Transfers: Sit to Stand;Stand to Sit Sit to Stand: 4: Min assist;From bed;From chair/3-in-1;4: Min guard Stand to Sit: 4: Min guard;1: +2 Total assist;To chair/3-in-1 Details for Transfer Assistance: VCs safety, technique, hand placement. Assist to rise, stabilize, control descent.  Ambulation/Gait Ambulation/Gait Assistance: 4: Min  guard Ambulation Distance (Feet): 75 Feet Assistive device: Rolling walker Ambulation/Gait Assistance Details: VCs safety, technique, sequence, adherence to WB.  Gait Pattern: Step-to pattern Stairs: Yes Stairs Assistance: 3: Mod assist;4: Min assist Stairs Assistance Details (indicate cue type and reason): VCs safety, technique, sequence. Practiced with 2 crutches and 1 crutch, 1 rail. Mod assist with 2 crutches, Min assist with 1 crutch, 1 rail. Recommended pt have 1 person assist for stair negotiation at home, espwcially on 1st attempt.  Number of Stairs: 12  (6 steps x 2, due to still connected to IV line)    Shoulder Instructions     Exercises General Exercises - Lower Extremity- encouraged pt to perform 10 reps of SLR and plenty of ankle pumps Ankle Circles/Pumps: Right;10 reps;Seated Straight Leg Raises: AROM;Right;5 reps;Seated   PT Diagnosis: Difficulty walking;Abnormality of gait;Acute pain  PT Problem List: Decreased activity tolerance;Decreased balance;Decreased mobility;Pain;Decreased knowledge of precautions;Decreased safety awareness;Decreased knowledge of use of DME PT Treatment Interventions: DME instruction;Gait training;Stair training;Functional mobility training;Therapeutic activities;Therapeutic exercise;Balance training;Patient/family education   PT Goals Acute Rehab PT Goals PT Goal Formulation: With patient Time For Goal Achievement: 07/24/12 Potential to Achieve Goals: Good Pt will go Supine/Side to Sit: with supervision PT Goal: Supine/Side to Sit - Progress: Goal set today Pt will go Sit to Supine/Side: with supervision PT Goal: Sit to Supine/Side - Progress: Goal set today Pt will go Sit to Stand: with supervision PT Goal: Sit to Stand - Progress: Goal set today Pt will Ambulate: 51 - 150 feet;with supervision;with rolling walker PT Goal: Ambulate - Progress: Goal set today Pt will Go Up / Down Stairs: 6-9 stairs;with  min assist;with least restrictive  assistive device;with rail(s) PT Goal: Up/Down Stairs - Progress: Goal set today  Visit Information  Last PT Received On: 07/17/12 Assistance Needed: +1    Subjective Data  Subjective: "I'm okay" Patient Stated Goal: Home   Prior Functioning  Home Living Lives With: Spouse Available Help at Discharge: Family Type of Home: House Home Layout: Two level Alternate Level Stairs-Number of Steps: 1 flight Alternate Level Stairs-Rails: Right Bathroom Shower/Tub: Engineer, manufacturing systems: Standard Prior Function Level of Independence: Independent Able to Take Stairs?: Yes Driving: Yes Vocation: Full time employment Communication Communication: No difficulties    Cognition  Overall Cognitive Status: Appears within functional limits for tasks assessed/performed Arousal/Alertness: Awake/alert Orientation Level: Appears intact for tasks assessed Behavior During Session: [facility] for tasks performed    Extremity/Trunk Assessment Right Lower Extremity Assessment RLE ROM/Strength/Tone: Deficits RLE ROM/Strength/Tone Deficits: SLR 3/5. moves ankle well.  Left Lower Extremity Assessment LLE ROM/Strength/Tone: WFL for tasks assessed Trunk Assessment Trunk Assessment: Normal   Balance    End of Session PT - End of Session Equipment Utilized During Treatment: Gait belt;Right knee immobilizer Activity Tolerance: Patient tolerated treatment well Patient left: in chair;with call bell/phone within reach  GP Functional Assessment Tool Used: clinical judgement Functional Limitation: Mobility: Walking and moving around Mobility: Walking and Moving Around Current Status (Z6109): At least 40 percent but less than 60 percent impaired, limited or restricted Mobility: Walking and Moving Around Goal Status [phone]): At least 1 percent but less than 20 percent impaired, limited or restricted Mobility: Walking and Moving Around Discharge Status [phone]): At least 1 percent but less than 20 percent  impaired, limited or restricted   Rebeca Alert [facility] 07/17/2012, 11:55 AM [phone]

## 2012-09-30 ENCOUNTER — Other Ambulatory Visit: Payer: Self-pay

## 2013-05-23 ENCOUNTER — Ambulatory Visit (INDEPENDENT_AMBULATORY_CARE_PROVIDER_SITE_OTHER): Payer: BC Managed Care – PPO | Admitting: Emergency Medicine

## 2013-05-23 VITALS — BP 122/78 | HR 79 | Temp 98.8°F | Resp 16 | Ht 69.25 in | Wt 204.0 lb

## 2013-05-23 DIAGNOSIS — H6093 Unspecified otitis externa, bilateral: Secondary | ICD-10-CM

## 2013-05-23 DIAGNOSIS — H60399 Other infective otitis externa, unspecified ear: Secondary | ICD-10-CM

## 2013-05-23 MED ORDER — NEOMYCIN-COLIST-HC-THONZONIUM 3.3-3-10-0.5 MG/ML OT SUSP: 4.0000 [drp] | Freq: Four times a day (QID) | OTIC | Status: DC

## 2013-05-23 NOTE — Progress Notes (Signed)
[facility] [address] [place] [address] [id number]  Date:  05/23/2013   Name:  Roger Wilcox   DOB:  Dec 31, 1950   MRN:  [id number]  PCP:  Pcp Not In System    Chief Complaint: Sore in ear   History of Present Illness:  Roger Wilcox is a 62 y.o. very pleasant male patient who presents with the following:  2 week history of pain in the right ear.  Irrigated his ear and recovered old blood and cerumen and last night had some bloody drainage.  No fever or chills.  No cough or coryza.   No improvement with over the counter medications or other home remedies. Denies other complaint or health concern today.   There are no active problems to display for this patient.   Past Medical History  Diagnosis Date  . Constipation   . Arthritis     lumbar stenosis    Past Surgical History  Procedure Laterality Date  . Neck fusion    . Neck fusion  2003  . Neck fusion  1982  . Appendectomy    . Lumbar laminectomy/decompression microdiscectomy  10/11/2011    Procedure: LUMBAR LAMINECTOMY/DECOMPRESSION MICRODISCECTOMY 4 LEVEL;  Surgeon: Clydene Fake, MD;  Location: [facility];  Service: Neurosurgery;  Laterality: N/A;  Lumbar Two to Sacral One Decompressive Laminectomy  . Quadriceps tendon repair  07/16/2012    Procedure: REPAIR QUADRICEP TENDON;  Surgeon: Toni Arthurs, MD;  Location: [facility] OR[facility];  Service: Orthopedics;  Laterality: Right;    History  Substance Use Topics  . Smoking status: Former Smoker -- 20 years    Quit date: 09/21/2008  . Smokeless tobacco: Never Used  . Alcohol Use: 1.8 oz/week    3 Cans of beer per week    Family History  Problem Relation Age of Onset  . Anesthesia problems Neg Hx     No Known Allergies  Medication list has been reviewed and updated.  Current Outpatient Prescriptions on File Prior to Visit  Medication Sig Dispense Refill  . naproxen sodium (ANAPROX) 220 MG tablet Take 440 mg by mouth 2 (two) times daily as  needed. For pain      . vitamin C (ASCORBIC ACID) 500 MG tablet Take 500 mg by mouth daily.      Marland Kitchen aspirin EC 325 MG EC tablet Take 1 tablet (325 mg total) by mouth daily.  42 tablet  0  . diclofenac (VOLTAREN) 75 MG EC tablet Take 75 mg by mouth 2 (two) times daily.      Marland Kitchen docusate sodium 100 MG CAPS Take 100 mg by mouth 2 (two) times daily.  60 capsule  0  . oxyCODONE (OXY IR/ROXICODONE) 5 MG immediate release tablet Take 1-2 tablets (5-10 mg total) by mouth every 4 (four) hours as needed for pain.  50 tablet  0  . senna (SENOKOT) 8.6 MG TABS Take 2 tablets (17.2 mg total) by mouth 2 (two) times daily.  60 tablet  0   No current facility-administered medications on file prior to visit.    Review of Systems:  As per HPI, otherwise negative.    Physical Examination: Filed Vitals:   05/23/13 0905  BP: 122/78  Pulse: 79  Temp: 98.8 F (37.1 C)  Resp: 16   Filed Vitals:   05/23/13 0905  Height: 5' 9.25" (1.759 m)  Weight: 204 lb (92.534 kg)   Body mass index is 29.91 kg/(m^2). Ideal Body Weight:  Weight in (lb) to have BMI = 25: 170.2   GEN: WDWN, NAD, Non-toxic, Alert & Oriented x 3 HEENT: Atraumatic, Normocephalic.  Ears and Nose: No external deformity.  Both ear canals clear of FB or drainage.  Multiple abrasions left canal.  Multiple swollen erythematous abrasions right. EXTR: No clubbing/cyanosis/edema NEURO: Normal gait.  PSYCH: Normally interactive. Conversant. Not depressed or anxious appearing.  Calm demeanor.    Assessment and Plan: Abrasions external canal Cortisporin otic Follow up in a week  Signed,  Phillips Odor, MD

## 2013-05-23 NOTE — Patient Instructions (Signed)
Otitis Externa Otitis externa is a bacterial or fungal infection of the outer ear canal. This is the area from the eardrum to the outside of the ear. Otitis externa is sometimes called "swimmer's ear." CAUSES  Possible causes of infection include:  Swimming in dirty water.  Moisture remaining in the ear after swimming or bathing.  Mild injury (trauma) to the ear.  Objects stuck in the ear (foreign body).  Cuts or scrapes (abrasions) on the outside of the ear. SYMPTOMS  The first symptom of infection is often itching in the ear canal. Later signs and symptoms may include swelling and redness of the ear canal, ear pain, and yellowish-white fluid (pus) coming from the ear. The ear pain may be worse when pulling on the earlobe. DIAGNOSIS  Your caregiver will perform a physical exam. A sample of fluid may be taken from the ear and examined for bacteria or fungi. TREATMENT  Antibiotic ear drops are often given for 10 to 14 days. Treatment may also include pain medicine or corticosteroids to reduce itching and swelling. PREVENTION   Keep your ear dry. Use the corner of a towel to absorb water out of the ear canal after swimming or bathing.  Avoid scratching or putting objects inside your ear. This can damage the ear canal or remove the protective wax that lines the canal. This makes it easier for bacteria and fungi to grow.  Avoid swimming in lakes, polluted water, or poorly chlorinated pools.  You may use ear drops made of rubbing alcohol and vinegar after swimming. Combine equal parts of white vinegar and alcohol in a bottle. Put 3 or 4 drops into each ear after swimming. HOME CARE INSTRUCTIONS   Apply antibiotic ear drops to the ear canal as prescribed by your caregiver.  Only take over-the-counter or prescription medicines for pain, discomfort, or fever as directed by your caregiver.  If you have diabetes, follow any additional treatment instructions from your caregiver.  Keep all  follow-up appointments as directed by your caregiver. SEEK MEDICAL CARE IF:   You have a fever.  Your ear is still red, swollen, painful, or draining pus after 3 days.  Your redness, swelling, or pain gets worse.  You have a severe headache.  You have redness, swelling, pain, or tenderness in the area behind your ear. MAKE SURE YOU:   Understand these instructions.  Will watch your condition.  Will get help right away if you are not doing well or get worse. Document Released: 08/02/2005 Document Revised: 10/25/2011 Document Reviewed: 08/19/2011 Exit[employer] Patient Information 2014 [employer].  

## 2013-06-01 ENCOUNTER — Ambulatory Visit (INDEPENDENT_AMBULATORY_CARE_PROVIDER_SITE_OTHER): Payer: BC Managed Care – PPO

## 2013-06-21 ENCOUNTER — Other Ambulatory Visit: Payer: Self-pay

## 2013-07-11 ENCOUNTER — Ambulatory Visit (INDEPENDENT_AMBULATORY_CARE_PROVIDER_SITE_OTHER): Payer: BC Managed Care – PPO | Admitting: Family Medicine

## 2013-07-11 ENCOUNTER — Encounter: Payer: Self-pay | Admitting: Family Medicine

## 2013-07-11 VITALS — BP 128/80 | HR 77 | Temp 97.9°F | Resp 16 | Ht 69.0 in | Wt 203.0 lb

## 2013-07-11 DIAGNOSIS — M1990 Unspecified osteoarthritis, unspecified site: Secondary | ICD-10-CM

## 2013-07-11 DIAGNOSIS — Z1211 Encounter for screening for malignant neoplasm of colon: Secondary | ICD-10-CM

## 2013-07-11 DIAGNOSIS — Z13220 Encounter for screening for lipoid disorders: Secondary | ICD-10-CM

## 2013-07-11 DIAGNOSIS — Z0000 Encounter for general adult medical examination without abnormal findings: Secondary | ICD-10-CM

## 2013-07-11 DIAGNOSIS — N529 Male erectile dysfunction, unspecified: Secondary | ICD-10-CM

## 2013-07-11 DIAGNOSIS — M5137 Other intervertebral disc degeneration, lumbosacral region: Secondary | ICD-10-CM | POA: Insufficient documentation

## 2013-07-11 DIAGNOSIS — N400 Enlarged prostate without lower urinary tract symptoms: Secondary | ICD-10-CM

## 2013-07-11 DIAGNOSIS — M5030 Other cervical disc degeneration, unspecified cervical region: Secondary | ICD-10-CM | POA: Insufficient documentation

## 2013-07-11 DIAGNOSIS — M1710 Unilateral primary osteoarthritis, unspecified knee: Secondary | ICD-10-CM | POA: Insufficient documentation

## 2013-07-11 DIAGNOSIS — Z23 Encounter for immunization: Secondary | ICD-10-CM

## 2013-07-11 DIAGNOSIS — Z1159 Encounter for screening for other viral diseases: Secondary | ICD-10-CM

## 2013-07-11 LAB — COMPREHENSIVE METABOLIC PANEL
ALT: 27 U/L (ref 0–53)
AST: 22 U/L (ref 0–37)
Alkaline Phosphatase: 56 U/L (ref 39–117)
Glucose, Bld: 72 mg/dL (ref 70–99)
Sodium: 138 mEq/L (ref 135–145)
Total Bilirubin: 0.9 mg/dL (ref 0.3–1.2)
Total Protein: 6.7 g/dL (ref 6.0–8.3)

## 2013-07-11 LAB — POCT URINALYSIS DIPSTICK
Glucose, UA: NEGATIVE
Leukocytes, UA: NEGATIVE
Nitrite, UA: NEGATIVE
Urobilinogen, UA: 1

## 2013-07-11 LAB — LIPID PANEL
HDL: 34 mg/dL — ABNORMAL LOW (ref >39)
Total CHOL/HDL Ratio: 5.8 Ratio

## 2013-07-11 LAB — IFOBT (OCCULT BLOOD): IFOBT: NEGATIVE

## 2013-07-11 MED ORDER — SILDENAFIL CITRATE 100 MG PO TABS: ORAL_TABLET | ORAL | Status: AC

## 2013-07-11 MED ORDER — ZOSTER VACCINE LIVE 19400 UNT/0.65ML ~~LOC~~ SOLR: 0.6500 mL | Freq: Once | SUBCUTANEOUS | Status: AC

## 2013-07-11 NOTE — Patient Instructions (Addendum)
Keeping you healthy  Get these tests  Blood pressure- Have your blood pressure checked once a year by your healthcare provider.  Normal blood pressure is 120/80  Weight- Have your body mass index (BMI) calculated to screen for obesity.  BMI is a measure of body fat based on height and weight. You can also calculate your own BMI at [email or web address].  Cholesterol- Have your cholesterol checked every year.  Diabetes- Have your blood sugar checked regularly if you have high blood pressure, high cholesterol, have a family history of diabetes or if you are overweight.  Screening for Colon Cancer- Colonoscopy starting at age 2.  Screening may begin sooner depending on your family history and other health conditions. Follow up colonoscopy as directed by your Gastroenterologist.  Screening for Prostate Cancer- Both blood work (PSA) and a rectal exam help screen for Prostate Cancer.  Screening begins at age 61 with African-American men and at age 65 with Caucasian men.  Screening may begin sooner depending on your family history.  We did a prostate blood test today. Your gland is slightly enlarged. If the blood test number is above normal, I will need to refer you to a specialist (Urologist) for further evaluation.  Take these medicines  Aspirin- One aspirin daily can help prevent Heart disease and Stroke.  Flu shot- Every fall. You declined this vaccine today.  Tetanus- Every 10 years.  Zostavax- Once after the age of 61 to prevent Shingles. You received a prescription for this vaccine today; take it to your local [facility] or [facility] or [facility] and the pharmacist will administer the vaccine.  Pneumonia shot- Once after the age of 61; if you are younger than 73, ask your healthcare provider if you need a Pneumonia shot.  Take these steps  Don't smoke- If you do smoke, talk to your doctor about quitting.  For tips on how to quit, go to www.smokefree.gov or call  1-800-QUIT-NOW.  Be physically active- Exercise 5 days a week for at least 30 minutes.  If you are not already physically active start slow and gradually work up to 30 minutes of moderate physical activity.  Examples of moderate activity include walking briskly, mowing the yard, dancing, swimming, bicycling, etc.  Eat a healthy diet- Eat a variety of healthy food such as fruits, vegetables, low fat milk, low fat cheese, yogurt, lean meant, poultry, fish, beans, tofu, etc. For more information go to www.thenutritionsource.org  Drink alcohol in moderation- Limit alcohol intake to less than two drinks a day. Never drink and drive.  Dentist- Brush and floss twice daily; visit your dentist twice a year.  Depression- Your emotional health is as important as your physical health. If you're feeling down, or losing interest in things you would normally enjoy please talk to your healthcare provider.  Eye exam- Visit your eye doctor every year.  Safe sex- If you may be exposed to a sexually transmitted infection, use a condom.  Seat belts- Seat belts can save your life; always wear one.  Smoke/Carbon Monoxide detectors- These detectors need to be installed on the appropriate level of your home.  Replace batteries at least once a year.  Skin cancer- When out in the sun, cover up and use sunscreen 15 SPF or higher.  Violence- If anyone is threatening you, please tell your healthcare provider.  Living Will/ Health care power of attorney- Speak with your healthcare provider and family.    I have advised that you start taking Fish Oil capsules  1000 mg 1 capsule daily. If your cholesterol numbers are elevated, I may advise you increase this to 2 capsules daily. This supplement is good for reducing inflammation as well.   Erectile Dysfunction Erectile dysfunction is the inability to get or sustain a good enough erection to have sexual intercourse. Erectile dysfunction may involve:  Inability to get  an erection.  Lack of enough hardness to allow penetration.  Loss of the erection before sex is finished.  Premature ejaculation. CAUSES  Certain drugs, such as:  Pain relievers.  Antihistamines.  Antidepressants.  Blood pressure medicines.  Water pills (diuretics).  Ulcer medicines.  Muscle relaxants.  Illegal drugs.  Excessive drinking.  Psychological causes, such as:  Anxiety.  Depression.  Sadness.  Exhaustion.  Performance fear.  Stress.  Physical causes, such as:  Artery problems. This may include diabetes, smoking, liver disease, or atherosclerosis.  High blood pressure.  Hormonal problems, such as low testosterone.  Obesity.  Nerve problems. This may include back or pelvic injuries, diabetes mellitus, multiple sclerosis, or Parkinson disease. SYMPTOMS  Inability to get an erection.  Lack of enough hardness to allow penetration.  Loss of the erection before sex is finished.  Premature ejaculation.  Normal erections at some times, but with frequent unsatisfactory episodes.  Orgasms that are not satisfactory in sensation or frequency.  Low sexual satisfaction in either partner because of erection problems.  A curved penis occurring with erection. The curve may cause pain or may be too curved to allow for intercourse.  Never having nighttime erections. DIAGNOSIS Your caregiver can often diagnose this condition by:  Performing a physical exam to find other diseases or specific problems with the penis.  Asking you detailed questions about the problem.  Performing blood tests to check for diabetes mellitus or to measure hormone levels.  Performing urine tests to find other underlying health conditions.  Performing an ultrasound exam to check for scarring.  Performing a test to check blood flow to the penis.  Doing a sleep study at home to measure nighttime erections. TREATMENT   You may be prescribed medicines by  mouth.  You may be given medicine injections into the penis.  You may be prescribed a vacuum pump with a ring.  Penile implant surgery may be performed. You may receive:  An inflatable implant.  A semirigid implant.  Blood vessel surgery may be performed. HOME CARE INSTRUCTIONS  If you are prescribed oral medicine, you should take the medicine as prescribed. Do not increase the dosage without first discussing it with your physician.  If you are using self-injections, be careful to avoid any veins that are on the surface of the penis. Apply pressure to the injection site for 5 minutes.  If you are using a vacuum pump, make sure you have read the instructions before using it. Discuss any questions with your physician before taking the pump home. SEEK MEDICAL CARE IF:  You experience pain that is not responsive to the pain medicine you have been prescribed.  You experience nausea or vomiting. SEEK IMMEDIATE MEDICAL CARE IF:   When taking oral or injectable medications, you experience an erection that lasts longer than 4 hours. If your physician is unavailable, go to the nearest emergency room for evaluation. An erection that lasts much longer than 4 hours can result in permanent damage to your penis.  You have pain that is severe.  You develop redness, severe pain, or severe swelling of your penis.  You have redness spreading up  into your groin or lower abdomen.  You are unable to pass your urine. Document Released: 07/30/2000 Document Revised: 04/04/2013 Document Reviewed: 01/04/2013 [facility] Patient Information 2014 [place], [place].

## 2013-07-11 NOTE — Progress Notes (Signed)
Subjective:    Patient ID: Roger Wilcox, male    DOB: 11-11-1950, 62 y.o.   MRN: [id number]  HPI This 62 y.o. Cauc male is here for annual CPE. His chronic medical problems are related to degenerative disc disease and DJD for which he has had surgeries. He continues to see a specialist for his knee pain. He has ED which he attributes to lumbar DDD and residual post-laminectomy surgery in Feb 2013. He requests trial of medication for ED.  HCM: IMM- pt declines all except Zoster ("I don't believe in them!)   Patient Active Problem List   Diagnosis Date Noted  . DDD (degenerative disc disease), lumbosacral 07/11/2013  . DDD (degenerative disc disease), cervical 07/11/2013  . DJD (degenerative joint disease) of knee 07/11/2013    Review of Systems  Constitutional: Negative.   HENT: Negative.   Eyes: Negative.   Respiratory: Negative.   Cardiovascular: Negative.   Gastrointestinal: Negative.   Endocrine: Negative.   Genitourinary: Negative.   Musculoskeletal: Positive for back pain and neck stiffness.  Skin: Negative.   Allergic/Immunologic: Negative.   Neurological: Negative.   Hematological: Negative.   Psychiatric/Behavioral: Negative.        Objective:   Physical Exam  Nursing note and vitals reviewed. Constitutional: He is oriented to person, place, and time. Vital signs are normal. He appears well-developed and well-nourished. No distress.  HENT:  Head: Normocephalic and atraumatic.  Right Ear: Hearing, external ear and ear canal normal. Tympanic membrane is scarred.  Left Ear: Hearing, external ear and ear canal normal. Tympanic membrane is scarred.  Nose: Nose normal. No mucosal edema, nasal deformity or septal deviation.  Mouth/Throat: Uvula is midline, oropharynx is clear and moist and mucous membranes are normal. No oral lesions. Normal dentition.  Eyes: Conjunctivae, EOM and lids are normal. Pupils are equal, round, and reactive to light. No scleral icterus.    Periodic vision evaluation performed by eye care specialist; pt wears corrective lenses.  Neck: Neck supple. No hepatojugular reflux and no JVD present. No spinous process tenderness and no muscular tenderness present. Carotid bruit is not present. Decreased range of motion present. No mass and no thyromegaly present.  Cardiovascular: Normal rate, regular rhythm, S1 normal, S2 normal and normal heart sounds.   No extrasystoles are present. PMI is not displaced.  Exam reveals no gallop, no distant heart sounds and no friction rub.   No murmur heard. Pulses:      Carotid pulses are 1+ on the right side, and 1+ on the left side.      Radial pulses are 2+ on the right side, and 2+ on the left side.       Femoral pulses are 2+ on the right side, and 2+ on the left side.      Popliteal pulses are 1+ on the right side, and 1+ on the left side.       Dorsalis pedis pulses are 1+ on the right side, and 1+ on the left side.       Posterior tibial pulses are 1+ on the right side, and 1+ on the left side.  Pulmonary/Chest: Effort normal and breath sounds normal. No respiratory distress.  Abdominal: Soft. Normal appearance and bowel sounds are normal. He exhibits no distension, no abdominal bruit, no pulsatile midline mass and no mass. There is no hepatosplenomegaly. There is no tenderness. There is no guarding and no CVA tenderness. No hernia.  Genitourinary: Rectum normal. Rectal exam shows no external hemorrhoid,  no fissure, no mass, no tenderness and anal tone normal. Guaiac negative stool. Prostate is enlarged. Prostate is not tender.  Prostate- R lobe > L lobe.  Musculoskeletal:       Right shoulder: Normal.       Left shoulder: Normal.       Right elbow: Normal.      Left elbow: Normal.       Right hip: He exhibits decreased range of motion. He exhibits normal strength, no tenderness, no bony tenderness and no deformity.       Left hip: He exhibits decreased range of motion. He exhibits normal  strength, no tenderness, no bony tenderness and no deformity.       Right knee: He exhibits decreased range of motion and deformity. He exhibits no swelling, no effusion, no erythema and normal patellar mobility. Tenderness found.       Left knee: Normal.       Cervical back: He exhibits decreased range of motion. He exhibits no tenderness, no bony tenderness, no deformity and no spasm.       Thoracic back: Normal.       Lumbar back: He exhibits decreased range of motion. He exhibits no tenderness, no bony tenderness, no swelling, no deformity and no spasm.       Right foot: He exhibits decreased range of motion and deformity. He exhibits no tenderness and no bony tenderness.       Left foot: He exhibits decreased range of motion and deformity. He exhibits no bony tenderness and no swelling.  Well healed midline scar over lumbar area.  Lymphadenopathy:       Head (right side): No submental, no submandibular, no tonsillar, no preauricular, no posterior auricular and no occipital adenopathy present.       Head (left side): No submental, no submandibular, no tonsillar, no preauricular, no posterior auricular and no occipital adenopathy present.    He has no cervical adenopathy.    He has no axillary adenopathy.       Right: No inguinal and no supraclavicular adenopathy present.       Left: No inguinal and no supraclavicular adenopathy present.  Neurological: He is alert and oriented to person, place, and time. He has normal strength and normal reflexes. He displays no atrophy and no tremor. No cranial nerve deficit or sensory deficit. He exhibits normal muscle tone. He displays a negative Romberg sign. Coordination and gait normal.  Skin: Skin is warm, dry and intact. No ecchymosis and no rash noted. He is not diaphoretic. No cyanosis or erythema. No pallor. Nails show no clubbing.  Psychiatric: He has a normal mood and affect. His speech is normal and behavior is normal. Judgment and thought content  normal. Cognition and memory are normal.       Assessment & Plan:  Routine general medical examination at a health care facility - Plan: POCT urinalysis dipstick, IFOBT POC (occult bld, rslt in office), Comprehensive metabolic panel  BPH (benign prostatic hypertrophy) - Plan: PSA  DJD (degenerative joint disease)  Erectile dysfunction of organic origin  Need for hepatitis C screening test - Plan: Hepatitis C antibody  Need for shingles vaccine - Plan: zoster vaccine live, PF, (ZOSTAVAX) 16109 UNT/0.65ML injection  Screening for hyperlipidemia - Plan: Lipid panel  Screening for colorectal cancer - Plan: Ambulatory referral to Gastroenterology   Meds ordered this encounter  Medications  . zoster vaccine live, PF, (ZOSTAVAX) 60454 UNT/0.65ML injection    Sig: Inject 19,400 Units into  the skin once.    Dispense:  1 each    Refill:  0  . sildenafil (VIAGRA) 100 MG tablet    Sig: Take 1/2 - 1 tablet at least 1 hour prior to sexual activity. Do not use more than once a day.    Dispense:  5 tablet    Refill:  5

## 2013-07-12 LAB — HEPATITIS C ANTIBODY: HCV Ab: NEGATIVE

## 2013-07-15 ENCOUNTER — Encounter: Payer: Self-pay | Admitting: Family Medicine

## 2013-07-15 DIAGNOSIS — N529 Male erectile dysfunction, unspecified: Secondary | ICD-10-CM | POA: Insufficient documentation

## 2013-08-31 ENCOUNTER — Encounter: Payer: Self-pay | Admitting: Internal Medicine

## 2013-09-20 ENCOUNTER — Encounter: Payer: Self-pay | Admitting: Internal Medicine

## 2013-09-20 ENCOUNTER — Ambulatory Visit (AMBULATORY_SURGERY_CENTER): Payer: Self-pay

## 2013-09-20 VITALS — Ht 69.0 in | Wt 210.0 lb

## 2013-09-20 DIAGNOSIS — Z1211 Encounter for screening for malignant neoplasm of colon: Secondary | ICD-10-CM

## 2013-09-20 MED ORDER — MOVIPREP 100 G PO SOLR: 1.0000 | Freq: Once | ORAL | Status: DC

## 2013-10-04 ENCOUNTER — Encounter: Payer: Self-pay | Admitting: Internal Medicine

## 2013-10-04 ENCOUNTER — Ambulatory Visit (AMBULATORY_SURGERY_CENTER): Payer: BC Managed Care – PPO | Admitting: Internal Medicine

## 2013-10-04 VITALS — BP 148/88 | HR 69 | Temp 98.1°F | Resp 15 | Ht 69.0 in | Wt 210.0 lb

## 2013-10-04 DIAGNOSIS — D126 Benign neoplasm of colon, unspecified: Secondary | ICD-10-CM

## 2013-10-04 DIAGNOSIS — Z1211 Encounter for screening for malignant neoplasm of colon: Secondary | ICD-10-CM

## 2013-10-04 MED ORDER — SODIUM CHLORIDE 0.9 % IV SOLN: 500.0000 mL | INTRAVENOUS | Status: DC

## 2013-10-04 NOTE — Progress Notes (Signed)
When IV was removed the tape pulled skin off the back of the hand near IV site, skin was cleaned and dressing was applied.-adm

## 2013-10-04 NOTE — Progress Notes (Signed)
Called to room to assist during endoscopic procedure.  Patient ID and intended procedure confirmed with present staff. Received instructions for my participation in the procedure from the performing physician.  

## 2013-10-04 NOTE — Progress Notes (Signed)
Report to pacu rn, vss, bbs=clear 

## 2013-10-04 NOTE — Patient Instructions (Signed)
YOU HAD AN ENDOSCOPIC PROCEDURE TODAY AT THE [facility]: Refer to the procedure report that was given to you for any specific questions about what was found during the examination.  If the procedure report does not answer your questions, please call your gastroenterologist to clarify.  If you requested that your care partner not be given the details of your procedure findings, then the procedure report has been included in a sealed envelope for you to review at your convenience later.  YOU SHOULD EXPECT: Some feelings of bloating in the abdomen. Passage of more gas than usual.  Walking can help get rid of the air that was put into your GI tract during the procedure and reduce the bloating. If you had a lower endoscopy (such as a colonoscopy or flexible sigmoidoscopy) you may notice spotting of blood in your stool or on the toilet paper. If you underwent a bowel prep for your procedure, then you may not have a normal bowel movement for a few days.  DIET: Your first meal following the procedure should be a light meal and then it is ok to progress to your normal diet.  A half-sandwich or bowl of soup is an example of a good first meal.  Heavy or fried foods are harder to digest and may make you feel nauseous or bloated.  Likewise meals heavy in dairy and vegetables can cause extra gas to form and this can also increase the bloating.  Drink plenty of fluids but you should avoid alcoholic beverages for 24 hours.  ACTIVITY: Your care partner should take you home directly after the procedure.  You should plan to take it easy, moving slowly for the rest of the day.  You can resume normal activity the day after the procedure however you should NOT DRIVE or use heavy machinery for 24 hours (because of the sedation medicines used during the test).    SYMPTOMS TO REPORT IMMEDIATELY: A gastroenterologist can be reached at any hour.  During normal business hours, 8:30 AM to 5:00 PM Monday through Friday,  call [phone].  After hours and on weekends, please call the GI answering service at [phone] who will take a message and have the physician on call contact you.   Following lower endoscopy (colonoscopy or flexible sigmoidoscopy):  Excessive amounts of blood in the stool  Significant tenderness or worsening of abdominal pains  Swelling of the abdomen that is new, acute  Fever of 100F or higher  FOLLOW UP: If any biopsies were taken you will be contacted by phone or by letter within the next 1-3 weeks.  Call your gastroenterologist if you have not heard about the biopsies in 3 weeks.  Our staff will call the home number listed on your records the next business day following your procedure to check on you and address any questions or concerns that you may have at that time regarding the information given to you following your procedure. This is a courtesy call and so if there is no answer at the home number and we have not heard from you through the emergency physician on call, we will assume that you have returned to your regular daily activities without incident.  SIGNATURES/CONFIDENTIALITY: You and/or your care partner have signed paperwork which will be entered into your electronic medical record.  These signatures attest to the fact that that the information above on your After Visit Summary has been reviewed and is understood.  Full responsibility of the confidentiality of this   discharge information lies with you and/or your care-partner.  Polyps, Diverticulosis-handouts given  Repeat colonoscopy will be determined by pathology.

## 2013-10-04 NOTE — Op Note (Signed)
[place]  [address] [place], [id number]   COLONOSCOPY PROCEDURE REPORT  PATIENT: Roger, Wilcox  MR#: [id number] BIRTHDATE: 1951/08/06 , 62  yrs. old GENDER: Male ENDOSCOPIST: Eustace Quail, MD REFERRED SW:NIOEVOJ McPherson, M.D. PROCEDURE DATE:  10/04/2013 PROCEDURE:   Colonoscopy with snare polypectomy x 3 First Screening Colonoscopy - Avg.  risk and is 50 yrs.  old or older Yes.  Prior Negative Screening - Now for repeat screening. N/A  History of Adenoma - Now for follow-up colonoscopy & has been > or = to 3 yrs.  N/A  Polyps Removed Today? Yes. ASA CLASS:   Class II INDICATIONS:average risk screening. MEDICATIONS: MAC sedation, administered by CRNA and propofol (Diprivan) 300mg  IV DESCRIPTION OF PROCEDURE:   After the risks benefits and alternatives of the procedure were thoroughly explained, informed consent was obtained.  A digital rectal exam revealed no abnormalities of the rectum.   The LB [id number]  endoscope was introduced through the anus and advanced to the cecum, which was identified by both the appendix and ileocecal valve. No adverse events experienced.   The quality of the prep was excellent, using MoviPrep  The instrument was then slowly withdrawn as the colon was fully examined.   COLON FINDINGS: Three polyps ranging between 3-40mm in size were found in the ascending colon (2) and rectum.  A polypectomy was performed with a cold snare.  The resection was complete and the polyp tissue was completely retrieved.   Moderate diverticulosis was noted throughout the entire examined colon.   The colon mucosa was otherwise normal.  Retroflexed views revealed internal hemorrhoids. The time to cecum=2 minutes 29 seconds.  Withdrawal time=14 minutes 15 seconds.  The scope was withdrawn and the procedure completed. COMPLICATIONS: There were no complications.  ENDOSCOPIC IMPRESSION: 1.   Three polyps  were found in the colon and rectum;  polypectomy was performed with a cold snare 2.   Moderate diverticulosis was noted throughout the entire examined colon 3.   The colon mucosa was otherwise normal  RECOMMENDATIONS: 1. Repeat colonoscopy in 5 years if polyp adenomatous; otherwise 10 years   eSigned:  Eustace Quail, MD 10/04/2013 1:26 PM   cc: Ellsworth Lennox, MD and The Patient

## 2013-10-04 NOTE — Progress Notes (Signed)
NO EGG OR SOY ALLERGY. EWM NO PROBLEMS WITH PAST SEDATION. EMW 

## 2013-10-05 ENCOUNTER — Telehealth: Payer: Self-pay | Admitting: *Deleted

## 2013-10-05 NOTE — Telephone Encounter (Signed)
  Follow up Call-  Call back number 10/04/2013  Post procedure Call Back phone  # [phone]  Permission to leave phone message Yes     Patient questions:  Do you have a fever, pain , or abdominal swelling? no Pain Score  0 *  Have you tolerated food without any problems? yes  Have you been able to return to your normal activities? yes  Do you have any questions about your discharge instructions: Diet   no Medications  no Follow up visit  no  Do you have questions or concerns about your Care? no  Actions: * If pain score is 4 or above: No action needed, pain <4.

## 2013-10-09 ENCOUNTER — Encounter: Payer: Self-pay | Admitting: Internal Medicine

## 2015-02-13 ENCOUNTER — Emergency Department (HOSPITAL_COMMUNITY): Payer: 59

## 2015-02-13 ENCOUNTER — Emergency Department (HOSPITAL_COMMUNITY)
Admission: EM | Admit: 2015-02-13 | Discharge: 2015-02-13 | Disposition: A | Discharge status: Home / Self Care | Payer: 59 | Attending: Emergency Medicine | Admitting: Emergency Medicine

## 2015-02-13 ENCOUNTER — Encounter (HOSPITAL_COMMUNITY): Payer: Self-pay | Admitting: Emergency Medicine

## 2015-02-13 DIAGNOSIS — R109 Unspecified abdominal pain: Secondary | ICD-10-CM

## 2015-02-13 DIAGNOSIS — Z87891 Personal history of nicotine dependence: Secondary | ICD-10-CM | POA: Diagnosis not present

## 2015-02-13 DIAGNOSIS — R1084 Generalized abdominal pain: Secondary | ICD-10-CM | POA: Insufficient documentation

## 2015-02-13 DIAGNOSIS — M1990 Unspecified osteoarthritis, unspecified site: Secondary | ICD-10-CM | POA: Diagnosis not present

## 2015-02-13 DIAGNOSIS — K5900 Constipation, unspecified: Secondary | ICD-10-CM | POA: Insufficient documentation

## 2015-02-13 DIAGNOSIS — R112 Nausea with vomiting, unspecified: Secondary | ICD-10-CM | POA: Diagnosis present

## 2015-02-13 LAB — CBC WITH DIFFERENTIAL/PLATELET
Basophils Absolute: 0 10*3/uL (ref 0.0–0.1)
Basophils Relative: 0 % (ref 0–1)
Eosinophils Absolute: 0 10*3/uL (ref 0.0–0.7)
Eosinophils Relative: 0 % (ref 0–5)
HEMATOCRIT: 47.8 % (ref 39.0–52.0)
HEMOGLOBIN: 16 g/dL (ref 13.0–17.0)
LYMPHS ABS: 0.6 10*3/uL — AB (ref 0.7–4.0)
LYMPHS PCT: 4 % — AB (ref 12–46)
MCH: 31.4 pg (ref 26.0–34.0)
MCHC: 33.5 g/dL (ref 30.0–36.0)
MCV: 93.7 fL (ref 78.0–100.0)
MONOS PCT: 5 % (ref 3–12)
Monocytes Absolute: 0.8 10*3/uL (ref 0.1–1.0)
Neutro Abs: 14.8 10*3/uL — ABNORMAL HIGH (ref 1.7–7.7)
Neutrophils Relative %: 91 % — ABNORMAL HIGH (ref 43–77)
Platelets: 289 10*3/uL (ref 150–400)
RBC: 5.1 MIL/uL (ref 4.22–5.81)
RDW: 13.6 % (ref 11.5–15.5)
WBC: 16.2 10*3/uL — AB (ref 4.0–10.5)

## 2015-02-13 LAB — COMPREHENSIVE METABOLIC PANEL
ALT: 16 U/L — ABNORMAL LOW (ref 17–63)
AST: 20 U/L (ref 15–41)
Albumin: 4 g/dL (ref 3.5–5.0)
Alkaline Phosphatase: 90 U/L (ref 38–126)
Anion gap: 11 (ref 5–15)
BILIRUBIN TOTAL: 1.2 mg/dL (ref 0.3–1.2)
BUN: 21 mg/dL — ABNORMAL HIGH (ref 6–20)
CALCIUM: 9.3 mg/dL (ref 8.9–10.3)
CHLORIDE: 102 mmol/L (ref 101–111)
CO2: 26 mmol/L (ref 22–32)
CREATININE: 1.07 mg/dL (ref 0.61–1.24)
Glucose, Bld: 145 mg/dL — ABNORMAL HIGH (ref 65–99)
POTASSIUM: 4.3 mmol/L (ref 3.5–5.1)
Sodium: 139 mmol/L (ref 135–145)
Total Protein: 7.6 g/dL (ref 6.5–8.1)

## 2015-02-13 LAB — URINALYSIS, ROUTINE W REFLEX MICROSCOPIC
BILIRUBIN URINE: NEGATIVE
Glucose, UA: NEGATIVE mg/dL
HGB URINE DIPSTICK: NEGATIVE
KETONES UR: 40 mg/dL — AB
Leukocytes, UA: NEGATIVE
Nitrite: POSITIVE — AB
Protein, ur: NEGATIVE mg/dL
SPECIFIC GRAVITY, URINE: 1.036 — AB (ref 1.005–1.030)
Urobilinogen, UA: 1 mg/dL (ref 0.0–1.0)
pH: 6.5 (ref 5.0–8.0)

## 2015-02-13 LAB — LIPASE, BLOOD: Lipase: 25 U/L (ref 22–51)

## 2015-02-13 LAB — URINE MICROSCOPIC-ADD ON

## 2015-02-13 MED ORDER — ONDANSETRON HCL 4 MG PO TABS: 4.0000 mg | ORAL_TABLET | Freq: Four times a day (QID) | ORAL | Status: AC

## 2015-02-13 MED ORDER — PANTOPRAZOLE SODIUM 40 MG IV SOLR
40.0000 mg | Freq: Once | INTRAVENOUS | Status: AC
  Administered 2015-02-13: 40 mg via INTRAVENOUS
  Filled 2015-02-13: qty 40

## 2015-02-13 MED ORDER — IOHEXOL 300 MG/ML  SOLN
50.0000 mL | Freq: Once | INTRAMUSCULAR | Status: AC | PRN
  Administered 2015-02-13: 25 mL via ORAL

## 2015-02-13 MED ORDER — FAMOTIDINE IN NACL 20-0.9 MG/50ML-% IV SOLN
20.0000 mg | Freq: Once | INTRAVENOUS | Status: AC
  Administered 2015-02-13: 20 mg via INTRAVENOUS
  Filled 2015-02-13: qty 50

## 2015-02-13 MED ORDER — ONDANSETRON HCL 4 MG/2ML IJ SOLN
4.0000 mg | Freq: Once | INTRAMUSCULAR | Status: AC
  Administered 2015-02-13: 4 mg via INTRAVENOUS
  Filled 2015-02-13: qty 2

## 2015-02-13 MED ORDER — SODIUM CHLORIDE 0.9 % IV BOLUS (SEPSIS)
1000.0000 mL | Freq: Once | INTRAVENOUS | Status: AC
  Administered 2015-02-13: 1000 mL via INTRAVENOUS

## 2015-02-13 MED ORDER — PANTOPRAZOLE SODIUM 20 MG PO TBEC: 20.0000 mg | DELAYED_RELEASE_TABLET | Freq: Two times a day (BID) | ORAL | Status: AC

## 2015-02-13 MED ORDER — MORPHINE SULFATE 4 MG/ML IJ SOLN
4.0000 mg | Freq: Once | INTRAMUSCULAR | Status: AC
  Administered 2015-02-13: 4 mg via INTRAVENOUS
  Filled 2015-02-13: qty 1

## 2015-02-13 MED ORDER — IOHEXOL 300 MG/ML  SOLN
100.0000 mL | Freq: Once | INTRAMUSCULAR | Status: AC | PRN
  Administered 2015-02-13: 100 mL via INTRAVENOUS

## 2015-02-13 MED ORDER — PROMETHAZINE HCL 25 MG/ML IJ SOLN
12.5000 mg | Freq: Once | INTRAMUSCULAR | Status: AC
  Administered 2015-02-13: 12.5 mg via INTRAVENOUS
  Filled 2015-02-13: qty 1

## 2015-02-13 NOTE — ED Notes (Signed)
Patient unable to void at this time

## 2015-02-13 NOTE — ED Notes (Signed)
Pt states he has been unable to hold anything down for the past three days  Pt states he has been vomiting and has abd pain  Pt states the last little bit he thinks he has blood in his emesis

## 2015-02-13 NOTE — Discharge Instructions (Signed)
Abdominal Pain °Many things can cause abdominal pain. Usually, abdominal pain is not caused by a disease and will improve without treatment. It can often be observed and treated at home. Your health care provider will do a physical exam and possibly order blood tests and X-rays to help determine the seriousness of your pain. However, in many cases, more time must pass before a clear cause of the pain can be found. Before that point, your health care provider may not know if you need more testing or further treatment. °HOME CARE INSTRUCTIONS  °Monitor your abdominal pain for any changes. The following actions may help to alleviate any discomfort you are experiencing: °· Only take over-the-counter or prescription medicines as directed by your health care provider. °· Do not take laxatives unless directed to do so by your health care provider. °· Try a clear liquid diet (broth, tea, or water) as directed by your health care provider. Slowly move to a bland diet as tolerated. °SEEK MEDICAL CARE IF: °· You have unexplained abdominal pain. °· You have abdominal pain associated with nausea or diarrhea. °· You have pain when you urinate or have a bowel movement. °· You experience abdominal pain that wakes you in the night. °· You have abdominal pain that is worsened or improved by eating food. °· You have abdominal pain that is worsened with eating fatty foods. °· You have a fever. °SEEK IMMEDIATE MEDICAL CARE IF:  °· Your pain does not go away within 2 hours. °· You keep throwing up (vomiting). °· Your pain is felt only in portions of the abdomen, such as the right side or the left lower portion of the abdomen. °· You pass bloody or black tarry stools. °MAKE SURE YOU: °· Understand these instructions.   °· Will watch your condition.   °· Will get help right away if you are not doing well or get worse.   °Document Released: 05/12/2005 Document Revised: 08/07/2013 Document Reviewed: 04/11/2013 °[employer]® Patient Information  ©2015 [employer]. This information is not intended to replace advice given to you by your health care provider. Make sure you discuss any questions you have with your health care provider. ° °Nausea and Vomiting °Nausea is a sick feeling that often comes before throwing up (vomiting). Vomiting is a reflex where stomach contents come out of your mouth. Vomiting can cause severe loss of body fluids (dehydration). Children and elderly adults can become dehydrated quickly, especially if they also have diarrhea. Nausea and vomiting are symptoms of a condition or disease. It is important to find the cause of your symptoms. °CAUSES  °· Direct irritation of the stomach lining. This irritation can result from increased acid production (gastroesophageal reflux disease), infection, food poisoning, taking certain medicines (such as nonsteroidal anti-inflammatory drugs), alcohol use, or tobacco use. °· Signals from the brain. These signals could be caused by a headache, heat exposure, an inner ear disturbance, increased pressure in the brain from injury, infection, a tumor, or a concussion, pain, emotional stimulus, or metabolic problems. °· An obstruction in the gastrointestinal tract (bowel obstruction). °· Illnesses such as diabetes, hepatitis, gallbladder problems, appendicitis, kidney problems, cancer, sepsis, atypical symptoms of a heart attack, or eating disorders. °· Medical treatments such as chemotherapy and radiation. °· Receiving medicine that makes you sleep (general anesthetic) during surgery. °DIAGNOSIS °Your caregiver may ask for tests to be done if the problems do not improve after a few days. Tests may also be done if symptoms are severe or if the reason for the nausea   and vomiting is not clear. Tests may include: °· Urine tests. °· Blood tests. °· Stool tests. °· Cultures (to look for evidence of infection). °· X-rays or other imaging studies. °Test results can help your caregiver make decisions about  treatment or the need for additional tests. °TREATMENT °You need to stay well hydrated. Drink frequently but in small amounts. You may wish to drink water, sports drinks, clear broth, or eat frozen ice pops or gelatin dessert to help stay hydrated. When you eat, eating slowly may help prevent nausea. There are also some antinausea medicines that may help prevent nausea. °HOME CARE INSTRUCTIONS  °· Take all medicine as directed by your caregiver. °· If you do not have an appetite, do not force yourself to eat. However, you must continue to drink fluids. °· If you have an appetite, eat a normal diet unless your caregiver tells you differently. °¨ Eat a variety of complex carbohydrates (rice, wheat, potatoes, bread), lean meats, yogurt, fruits, and vegetables. °¨ Avoid high-fat foods because they are more difficult to digest. °· Drink enough water and fluids to keep your urine clear or pale yellow. °· If you are dehydrated, ask your caregiver for specific rehydration instructions. Signs of dehydration may include: °¨ Severe thirst. °¨ Dry lips and mouth. °¨ Dizziness. °¨ Dark urine. °¨ Decreasing urine frequency and amount. °¨ Confusion. °¨ Rapid breathing or pulse. °SEEK IMMEDIATE MEDICAL CARE IF:  °· You have blood or brown flecks (like coffee grounds) in your vomit. °· You have black or bloody stools. °· You have a severe headache or stiff neck. °· You are confused. °· You have severe abdominal pain. °· You have chest pain or trouble breathing. °· You do not urinate at least once every 8 hours. °· You develop cold or clammy skin. °· You continue to vomit for longer than 24 to 48 hours. °· You have a fever. °MAKE SURE YOU:  °· Understand these instructions. °· Will watch your condition. °· Will get help right away if you are not doing well or get worse. °Document Released: 08/02/2005 Document Revised: 10/25/2011 Document Reviewed: 12/30/2010 °Exit[employer]® Patient Information ©2015 [employer]. This information is not  intended to replace advice given to you by your health care provider. Make sure you discuss any questions you have with your health care provider. ° °

## 2015-02-13 NOTE — ED Provider Notes (Signed)
CSN: [id number]     Arrival date & time 02/13/15  0417 History   First MD Initiated Contact with Patient 02/13/15 0430     Chief Complaint  Patient presents with  . Abdominal Pain  . Emesis     (Consider location/radiation/quality/duration/timing/severity/associated sxs/prior Treatment) Patient is a 64 y.o. male presenting with abdominal pain and vomiting. The history is provided by the patient and the spouse. No language interpreter was used.  Abdominal Pain Pain location:  Generalized Pain severity:  Moderate Onset quality:  Gradual Duration:  3 days Timing:  Constant Progression:  Worsening Associated symptoms: constipation, nausea and vomiting   Associated symptoms: no chills, no diarrhea and no fever   Associated symptoms comment:  Patient with no chronic medical problems presents with 3 day history of nausea and vomiting with upper greater than lower abdominal pain. He has not had any diarrhea and, in fact, states he has had no bowel movement at all since symptoms started 3 days ago. He is passing very little gas. No previous abdominal surgeries. No fever. Tonight he saw blood in his emesis. No chest pain, abdominal pan, dysuria, chest pain or SOB. Emesis Associated symptoms: abdominal pain   Associated symptoms: no chills and no diarrhea     Past Medical History  Diagnosis Date  . Constipation   . Arthritis     lumbar stenosis   Past Surgical History  Procedure Laterality Date  . Neck fusion    . Neck fusion  2003  . Neck fusion  1982  . Appendectomy    . Lumbar laminectomy/decompression microdiscectomy  10/11/2011    Procedure: LUMBAR LAMINECTOMY/DECOMPRESSION MICRODISCECTOMY 4 LEVEL;  Surgeon: Otilio Connors, MD;  Location: [place] [facility];  Service: Neurosurgery;  Laterality: N/A;  Lumbar Two to Sacral One Decompressive Laminectomy  . Quadriceps tendon repair  07/16/2012    Procedure: REPAIR QUADRICEP TENDON;  Surgeon: Wylene Simmer, MD;  Location: [facility];  Service:  Orthopedics;  Laterality: Right;   Family History  Problem Relation Age of Onset  . Anesthesia problems Neg Hx   . Colon cancer Neg Hx   . Pancreatic cancer Neg Hx   . Stomach cancer Neg Hx    History  Substance Use Topics  . Smoking status: Former Smoker -- 20 years    Quit date: 09/21/2008  . Smokeless tobacco: Never Used  . Alcohol Use: 1.8 oz/week    3 Cans of beer per week     Comment: 4 drinks    Review of Systems  Constitutional: Negative for fever and chills.  Respiratory: Negative.   Cardiovascular: Negative.   Gastrointestinal: Positive for nausea, vomiting, abdominal pain and constipation. Negative for diarrhea and blood in stool.  Musculoskeletal: Negative.   Skin: Negative.   Neurological: Negative.       Allergies  Review of patient's allergies indicates no known allergies.  Home Medications   Prior to Admission medications   Medication Sig Start Date End Date Taking? Authorizing Provider  naproxen sodium (ANAPROX) 220 MG tablet Take 440 mg by mouth 2 (two) times daily as needed. For pain   Yes Historical Provider, MD  aspirin EC 325 MG EC tablet Take 1 tablet (325 mg total) by mouth daily. Patient not taking: Reported on 02/13/2015 07/17/12   Wylene Simmer, MD  sildenafil (VIAGRA) 100 MG tablet Take 1/2 - 1 tablet at least 1 hour prior to sexual activity. Do not use more than once a day. Patient not taking: Reported on 02/13/2015 07/11/13  Barton Fanny, MD  zoster vaccine live, PF, (ZOSTAVAX) 29937 UNT/0.65ML injection Inject 19,400 Units into the skin once. Patient not taking: Reported on 02/13/2015 07/11/13   Barton Fanny, MD   BP 143/88 mmHg  Pulse 68  Temp(Src) 97.8 F (36.6 C) (Oral)  Resp 20  Ht 5\' 9"  (1.753 m)  Wt 200 lb (90.719 kg)  BMI 29.52 kg/m2  SpO2 97% Physical Exam  Constitutional: He is oriented to person, place, and time. He appears well-developed and well-nourished. No distress.  HENT:  Head: Normocephalic.  Eyes:  Conjunctivae are normal.  Neck: Normal range of motion. Neck supple.  Cardiovascular: Normal rate and regular rhythm.   Pulmonary/Chest: Effort normal and breath sounds normal.  Abdominal: Soft. Bowel sounds are normal. He exhibits no distension and no mass. There is tenderness. There is no rebound and no guarding.  Diffusely tender to soft abdomen with positive bowel sounds. No palpable masses.   Musculoskeletal: Normal range of motion.  Neurological: He is alert and oriented to person, place, and time.  Skin: Skin is warm and dry. No rash noted.  Psychiatric: He has a normal mood and affect.    ED Course  Procedures (including critical care time) Labs Review Labs Reviewed  CBC WITH DIFFERENTIAL/PLATELET - Abnormal; Notable for the following:    WBC 16.2 (*)    Neutrophils Relative % 91 (*)    Neutro Abs 14.8 (*)    Lymphocytes Relative 4 (*)    Lymphs Abs 0.6 (*)    All other components within normal limits  COMPREHENSIVE METABOLIC PANEL - Abnormal; Notable for the following:    Glucose, Bld 145 (*)    BUN 21 (*)    ALT 16 (*)    All other components within normal limits  LIPASE, BLOOD  URINALYSIS, ROUTINE W REFLEX MICROSCOPIC (NOT AT [facility])    Imaging Review No results found.   EKG Interpretation None      MDM   Final diagnoses:  None    1. Abdominal pain 2. Nausea and vomiting  63-yo with abdominal pain associated with N, V and decreased BM. CT scan pending to r/o obstruction, other abnormality.  Patient will be re-evaluated by Dr. Wilson Singer after lab and imaging studies resulted.     Charlann Lange, PA-C 02/13/15 1696  Virgel Manifold, MD 02/13/15 [phone]

## 2015-05-02 ENCOUNTER — Other Ambulatory Visit: Payer: Self-pay | Admitting: Orthopaedic Surgery

## 2015-05-02 DIAGNOSIS — M4716 Other spondylosis with myelopathy, lumbar region: Secondary | ICD-10-CM

## 2015-05-13 ENCOUNTER — Ambulatory Visit: Payer: 59 | Attending: Orthopaedic Surgery | Admitting: Physical Therapy

## 2015-05-13 ENCOUNTER — Encounter: Payer: Self-pay | Admitting: Physical Therapy

## 2015-05-13 DIAGNOSIS — M629 Disorder of muscle, unspecified: Secondary | ICD-10-CM | POA: Insufficient documentation

## 2015-05-13 DIAGNOSIS — M5440 Lumbago with sciatica, unspecified side: Secondary | ICD-10-CM

## 2015-05-13 DIAGNOSIS — R262 Difficulty in walking, not elsewhere classified: Secondary | ICD-10-CM | POA: Diagnosis present

## 2015-05-13 NOTE — Therapy (Signed)
[place] [place], [place], [address] Phone: [phone]   Fax:  [phone]  Physical Therapy Evaluation  Patient Details  Name: Roger Wilcox MRN: [id number] Date of Birth: 1951/02/13 Referring Provider:  Starling Manns, MD  Encounter Date: 05/13/2015      PT End of Session - 05/13/15 1030    Visit Number 1   Date for PT Re-Evaluation 07/13/15   PT Start Time 1000   PT Stop Time 1100   PT Time Calculation (min) 60 min   Activity Tolerance Patient tolerated treatment well   Behavior During Therapy [facility] for tasks assessed/performed      Past Medical History  Diagnosis Date  . Constipation   . Arthritis     lumbar stenosis    Past Surgical History  Procedure Laterality Date  . Neck fusion    . Neck fusion  2003  . Neck fusion  1982  . Appendectomy    . Lumbar laminectomy/decompression microdiscectomy  10/11/2011    Procedure: LUMBAR LAMINECTOMY/DECOMPRESSION MICRODISCECTOMY 4 LEVEL;  Surgeon: Otilio Connors, MD;  Location: [place] [facility];  Service: Neurosurgery;  Laterality: N/A;  Lumbar Two to Sacral One Decompressive Laminectomy  . Quadriceps tendon repair  07/16/2012    Procedure: REPAIR QUADRICEP TENDON;  Surgeon: Wylene Simmer, MD;  Location: [facility];  Service: Orthopedics;  Laterality: Right;    There were no vitals filed for this visit.  Visit Diagnosis:  Midline low back pain with sciatica, sciatica laterality unspecified - Plan: PT plan of care cert/re-cert  Hamstring tightness of both lower extremities - Plan: PT plan of care cert/re-cert  Difficulty walking - Plan: PT plan of care cert/re-cert      Subjective Assessment - 05/13/15 1005    Subjective Patient with c/o back pain, has history of cervical fusions x2 most recent 2004, had a laminectomy 3 years.  Reports a right quad tendon repair about 2 years ago.  Reports about a year ago he started having some increased back and leg pain.   MRI is schedule. for next week.   Limitations Walking   How long can you walk comfortably? 1/2 block without Tramadol   Patient Stated Goals be able to mow yard and have less pain   Currently in Pain? Yes   Pain Score 4    Pain Location Back  some pain in the legs without medication   Pain Orientation Lower   Pain Descriptors / Indicators Aching;Cramping;Tightness   Pain Type Chronic pain   Pain Onset More than a month ago   Pain Frequency Constant   Aggravating Factors  bending, walking, trying to mow yard pain will get up to 8/10   Pain Relieving Factors rest and pain medication the pain is down to a 2/10   Effect of Pain on Daily Activities can't mow the yard, taking so much NSAIDS caused an ulcer            [facility] PT Assessment - 05/13/15 0001    Assessment   Medical Diagnosis low back p   Onset Date/Surgical Date 05/12/14   Next MD Visit 05/27/15   Prior Therapy no   Precautions   Precautions None   Balance Screen   Has the patient fallen in the past 6 months No   Has the patient had a decrease in activity level because of a fear of falling?  No   Is the patient reluctant to leave their home  because of a fear of falling?  No   Home Environment   Living Environment Private residence   Additional Comments has stairs was able to mow the yard   Prior Function   Level of Independence Independent   Vocation Full time employment   Vocation Requirements mostly sitting   Leisure sometimes elliptical   AROM   Overall AROM Comments lumbar ROM decreased 75% with c/o stiffness and some pain   Strength   Overall Strength Comments 4-/5 with the LE's   Flexibility   Soft Tissue Assessment /Muscle Length --  very tight HS and piriformis mms   Palpation   Palpation comment very tight but minimal tenderness in the lumbar paraspinals and buttocks,    Special Tests    Special Tests --  negative slump                   [facility] Adult PT Treatment/Exercise - 05/13/15 0001     Exercises   Exercises Lumbar   Lumbar Exercises: Aerobic   Tread Mill NuStep level 4 x 5 minutes   Modalities   Modalities Moist Heat;Electrical Stimulation   Moist Heat Therapy   Number Minutes Moist Heat 15 Minutes   Moist Heat Location Lumbar Spine   Electrical Stimulation   Electrical Stimulation Location lumbar spine   Electrical Stimulation Action IFC   Electrical Stimulation Parameters tolerance   Electrical Stimulation Goals Pain                  PT Short Term Goals - 05/13/15 1040    PT SHORT TERM GOAL #1   Title independent with initial HEP   Time 2   Period Weeks   Status New           PT Long Term Goals - 05/13/15 1041    PT LONG TERM GOAL #1   Title decrease pain 50%   Time 8   Period Weeks   Status New   PT LONG TERM GOAL #2   Title increase lumbar ROM 25%   Time 8   Period Weeks   Status New   PT LONG TERM GOAL #3   Title increase SLR to 65 degrees bilaterally   Time 8   Period Weeks   Status New   PT LONG TERM GOAL #4   Title tolerate walking 1/2 mile   Time 8   Period Weeks   Status New   PT LONG TERM GOAL #5   Title understand proper posture and body mechanics for ADL's   Time 8   Period Weeks   Status New               Plan - 05/13/15 1038    Clinical Impression Statement Patient with a history of LBP and lumbar laminectomies.  He is very tight in the LE's.  Very limited ROM of the Lumbar spine.  SLR was 40 degrees bilaterally due to HS tightness.  Cannot tolerate walking due to pain and cramping in the legs.   Pt will benefit from skilled therapeutic intervention in order to improve on the following deficits Decreased activity tolerance;Decreased mobility;Decreased strength;Decreased range of motion;Difficulty walking;Impaired flexibility;Pain   Rehab Potential Good   PT Frequency 2x / week   PT Duration 8 weeks   PT Treatment/Interventions ADLs/Self Care Home Management;Electrical Stimulation;Moist  Heat;Therapeutic exercise;Functional mobility training;Ultrasound;Traction;Patient/family education;Manual techniques   PT Next Visit Plan Add gym exercises and flexibility   Consulted and Agree with Plan of Care  Patient         Problem List Patient Active Problem List   Diagnosis Date Noted  . Erectile dysfunction of organic origin 07/15/2013  . DDD (degenerative disc disease), lumbosacral 07/11/2013  . DDD (degenerative disc disease), cervical 07/11/2013  . DJD (degenerative joint disease) of knee 07/11/2013    Sumner Boast., PT 05/13/2015, 10:44 AM  [address] [place], [place], [address] Phone: [phone]   Fax:  [phone]

## 2015-05-13 NOTE — Patient Instructions (Signed)
Knee-to-Chest: with Neck Flexion Stretch (Supine)   Pull left knee to chest, tucking chin and lifting head. Hold __10__ seconds. Relax. Repeat _10___ times per set. Do _2___ sets per session. Do __2__ sessions per day.  Trunk: Knees to Chest   Lie on firm, flat surface. Keep head and shoulders flat on surface. Tuck hands behind knees and pull to chest. Hold _10___ seconds. Repeat __10__ times. Do _2___ sessions per day. CAUTION: Movement should be gentle and slow.  Caudal Rotation: Hip Roll, Neutral Lordosis - Supine   Lie with knees bent and slightly elevated, feet flat. Tighten stomach, lower knees out to right side, rotating hips and trunk. Keep stomach tight for return. Repeat _10___ times per set. Do __2__ sets per session. Do _2___ sessions per week.  Pelvic Tilt: Anterior - Legs Bent (Supine)   Rotate pelvis up and arch back. Hold ____ seconds. Relax. Repeat ____ times per set. Do ____ sets per session. Do ____ sessions per day.  Piriformis Stretch   Lying on back, pull right knee toward opposite shoulder. Hold __30__ seconds. Repeat _4___ times. Do _2___ sessions per day.  Neurovascular: Sciatic Nerve Knee Bias - Supine   Lie with right leg up against doorjamb, toes and ankle pulled toward nose, knees slightly bent. Press knee toward wall as far as possible without pain. Repeat _10___ times per set. Do _2___ sets per session. Do ____ sessions per day.  Neurovascular: Sciatic Nerve Stretch - Supine   Lie with right leg up against doorjamb, head supported. Lift head, pulling chin down toward chest. Repeat _10___ times per set. Do _2___ sets per session. Do _2___ sessions per day.  Neurovascular: Sciatic Nerve Stretch With Knee Bias - Sitting   Sit with hands behind, chin on chest, back relaxed and rounded. Straighten left knee as far as possible without pain. Repeat _10___ times per set. Do __2__ sets per session. Do _2___ sessions per day.  STRAIGHT LEG RAISE:  Flossing I   Lie on back, hands behind right thigh. Knee straight, simultaneously tuck chin to chest and bend knee. Do _2_ sets of _10_ repetitions per session. Do _2__ sessions per day.

## 2015-05-15 ENCOUNTER — Ambulatory Visit
Admission: RE | Admit: 2015-05-15 | Discharge: 2015-05-15 | Disposition: A | Discharge status: Home / Self Care | Payer: 59 | Source: Ambulatory Visit | Attending: Orthopaedic Surgery | Admitting: Orthopaedic Surgery

## 2015-05-15 DIAGNOSIS — M4716 Other spondylosis with myelopathy, lumbar region: Secondary | ICD-10-CM

## 2015-05-19 ENCOUNTER — Ambulatory Visit: Payer: 59 | Attending: Orthopaedic Surgery | Admitting: Physical Therapy

## 2015-05-19 ENCOUNTER — Encounter: Payer: Self-pay | Admitting: Physical Therapy

## 2015-05-19 DIAGNOSIS — R262 Difficulty in walking, not elsewhere classified: Secondary | ICD-10-CM | POA: Diagnosis present

## 2015-05-19 DIAGNOSIS — M5440 Lumbago with sciatica, unspecified side: Secondary | ICD-10-CM

## 2015-05-19 DIAGNOSIS — M629 Disorder of muscle, unspecified: Secondary | ICD-10-CM | POA: Diagnosis present

## 2015-05-19 NOTE — Therapy (Signed)
[place] [address] [place], [place], [address] Phone: [phone]   Fax:  [phone]  Physical Therapy Treatment  Patient Details  Name: Roger Wilcox MRN: [id number] Date of Birth: 07/03/1951 Referring Provider:  Starling Manns, MD  Encounter Date: 05/19/2015      PT End of Session - 05/19/15 1057    Visit Number 2   Date for PT Re-Evaluation 07/13/15   PT Start Time 6283   PT Stop Time 1111   PT Time Calculation (min) 56 min   Activity Tolerance Patient tolerated treatment well   Behavior During Therapy [facility] for tasks assessed/performed      Past Medical History  Diagnosis Date  . Constipation   . Arthritis     lumbar stenosis    Past Surgical History  Procedure Laterality Date  . Neck fusion    . Neck fusion  2003  . Neck fusion  1982  . Appendectomy    . Lumbar laminectomy/decompression microdiscectomy  10/11/2011    Procedure: LUMBAR LAMINECTOMY/DECOMPRESSION MICRODISCECTOMY 4 LEVEL;  Surgeon: Otilio Connors, MD;  Location: [place] [facility];  Service: Neurosurgery;  Laterality: N/A;  Lumbar Two to Sacral One Decompressive Laminectomy  . Quadriceps tendon repair  07/16/2012    Procedure: REPAIR QUADRICEP TENDON;  Surgeon: Wylene Simmer, MD;  Location: [facility];  Service: Orthopedics;  Laterality: Right;    There were no vitals filed for this visit.  Visit Diagnosis:  Midline low back pain with sciatica, sciatica laterality unspecified  Hamstring tightness of both lower extremities      Subjective Assessment - 05/19/15 1016    Subjective Pt reports that he has been doing his HEP, HEP fell good while he is doing it but has soreness later in the day    Currently in Pain? Yes   Pain Score 4    Pain Location Hip  & low back    Pain Orientation Left   Pain Descriptors / Indicators Aching   Pain Type Chronic pain                         [facility] Adult PT Treatment/Exercise - 05/19/15 0001     Lumbar Exercises: Stretches   Passive Hamstring Stretch 3 reps;10 seconds   Piriformis Stretch 3 reps;10 seconds   Lumbar Exercises: Aerobic   Tread Mill NuStep level 6 x 7 minutes   Lumbar Exercises: Machines for Strengthening   Cybex Knee Extension 35lb 3x10   Cybex Knee Flexion 45lb 3x10   Leg Press 60lb 3x15 , heel raises 60lb 30 reps    Other Lumbar Machine Exercise Standing rows 45lb 2x15    Lumbar Exercises: Standing   Other Standing Lumbar Exercises HIp ext #10 2x10,   Lumbar Exercises: Seated   Sit to Stand 10 reps  10lb,  2x10   Modalities   Modalities Moist Heat;Electrical Stimulation   Moist Heat Therapy   Number Minutes Moist Heat 15 Minutes   Moist Heat Location Lumbar Spine   Electrical Stimulation   Electrical Stimulation Location lumbar spine   Electrical Stimulation Action IFC   Electrical Stimulation Parameters tolerance    Electrical Stimulation Goals Pain                  PT Short Term Goals - 05/19/15 1059    PT SHORT TERM GOAL #1   Title independent with initial HEP   Status Achieved  PT Long Term Goals - 05/13/15 1041    PT LONG TERM GOAL #1   Title decrease pain 50%   Time 8   Period Weeks   Status New   PT LONG TERM GOAL #2   Title increase lumbar ROM 25%   Time 8   Period Weeks   Status New   PT LONG TERM GOAL #3   Title increase SLR to 65 degrees bilaterally   Time 8   Period Weeks   Status New   PT LONG TERM GOAL #4   Title tolerate walking 1/2 mile   Time 8   Period Weeks   Status New   PT LONG TERM GOAL #5   Title understand proper posture and body mechanics for ADL's   Time 8   Period Weeks   Status New               Plan - 05/19/15 1057    Clinical Impression Statement Pt able to tolerate gym level exercises well. Pt does require cues for pacing. Pt motivated and works well although has a decrease activity tolerance. Pt does have pain when performing hip ext with LLE and was unable to  complete.    Pt will benefit from skilled therapeutic intervention in order to improve on the following deficits Decreased activity tolerance;Decreased mobility;Decreased strength;Decreased range of motion;Difficulty walking;Impaired flexibility;Pain   Rehab Potential Good   PT Frequency 2x / week   PT Duration 8 weeks   PT Treatment/Interventions ADLs/Self Care Home Management;Electrical Stimulation;Moist Heat;Therapeutic exercise;Functional mobility training;Ultrasound;Traction;Patient/family education;Manual techniques   PT Next Visit Plan gym exercises and flexibility        Problem List Patient Active Problem List   Diagnosis Date Noted  . Erectile dysfunction of organic origin 07/15/2013  . DDD (degenerative disc disease), lumbosacral 07/11/2013  . DDD (degenerative disc disease), cervical 07/11/2013  . DJD (degenerative joint disease) of knee 07/11/2013    Scot Jun, PTA  05/19/2015, 11:02 AM  [address] [place] [place], [place], [address] Phone: [phone]   Fax:  [phone]

## 2015-05-22 ENCOUNTER — Encounter: Payer: Self-pay | Admitting: Physical Therapy

## 2015-05-22 ENCOUNTER — Ambulatory Visit: Payer: 59 | Admitting: Physical Therapy

## 2015-05-22 DIAGNOSIS — M629 Disorder of muscle, unspecified: Secondary | ICD-10-CM

## 2015-05-22 DIAGNOSIS — M5440 Lumbago with sciatica, unspecified side: Secondary | ICD-10-CM | POA: Diagnosis not present

## 2015-05-22 NOTE — Therapy (Signed)
[place], [place], [address] Phone: [phone]   Fax:  [phone]  Physical Therapy Treatment  Patient Details  Name: Roger Wilcox MRN: [id number] Date of Birth: 1950/09/14 Referring Provider:  Starling Manns, MD  Encounter Date: 05/22/2015      PT End of Session - 05/22/15 1102    Visit Number 3   Date for PT Re-Evaluation 07/13/15   PT Start Time 1638   PT Stop Time 1117   PT Time Calculation (min) 62 min   Activity Tolerance Patient tolerated treatment well   Behavior During Therapy [facility] for tasks assessed/performed      Past Medical History  Diagnosis Date  . Constipation   . Arthritis     lumbar stenosis    Past Surgical History  Procedure Laterality Date  . Neck fusion    . Neck fusion  2003  . Neck fusion  1982  . Appendectomy    . Lumbar laminectomy/decompression microdiscectomy  10/11/2011    Procedure: LUMBAR LAMINECTOMY/DECOMPRESSION MICRODISCECTOMY 4 LEVEL;  Surgeon: Otilio Connors, MD;  Location: [place] [facility];  Service: Neurosurgery;  Laterality: N/A;  Lumbar Two to Sacral One Decompressive Laminectomy  . Quadriceps tendon repair  07/16/2012    Procedure: REPAIR QUADRICEP TENDON;  Surgeon: Wylene Simmer, MD;  Location: [facility];  Service: Orthopedics;  Laterality: Right;    There were no vitals filed for this visit.  Visit Diagnosis:  Hamstring tightness of both lower extremities  Midline low back pain with sciatica, sciatica laterality unspecified      Subjective Assessment - 05/22/15 1016    Subjective Pt reports a dull ach in R hip that started yesterday afternoon and now.   Patient Stated Goals be able to mow yard and have less pain   Currently in Pain? No/denies   Pain Score 0-No pain            [facility] PT Assessment - 05/22/15 0001    AROM   Overall AROM Comments lumbar ROM decreased 50% with c/o stiffness and some pain                      [facility] Adult PT Treatment/Exercise - 05/22/15 0001    Lumbar Exercises: Stretches   Passive Hamstring Stretch 5 reps;10 seconds   Piriformis Stretch 3 reps;10 seconds   Lumbar Exercises: Aerobic   Tread Mill NuStep level 6 x 7 minutes   Lumbar Exercises: Machines for Strengthening   Cybex Knee Extension 35lb 3x10   Cybex Knee Flexion 45lb 3x10   Leg Press 70lb 3x15 , heel raises 70lb 30 reps    Lumbar Exercises: Standing   Row AROM;Strengthening;Power tower;Both;15 reps  2 sets    Other Standing Lumbar Exercises HIp ext #10 2x10,   Lumbar Exercises: Seated   Sit to Stand 10 reps  x2 10lb   Lumbar Exercises: Supine   Other Supine Lumbar Exercises Seated Rows #45 2x15; Lat pul downs #45 2x15    Modalities   Modalities Moist Heat;Electrical Stimulation   Moist Heat Therapy   Number Minutes Moist Heat 15 Minutes   Moist Heat Location Lumbar Spine   Electrical Stimulation   Electrical Stimulation Location lumbar spine   Electrical Stimulation Action IFC   Electrical Stimulation Parameters tolerance    Electrical Stimulation Goals Pain                  PT Short  Term Goals - 05/19/15 1059    PT SHORT TERM GOAL #1   Title independent with initial HEP   Status Achieved           PT Long Term Goals - 05/22/15 1105    PT LONG TERM GOAL #1   Title decrease pain 50%   Status Partially Met   PT LONG TERM GOAL #3   Title increase SLR to 65 degrees bilaterally   Status On-going               Plan - 05/22/15 1103    Clinical Impression Statement Pt does have some muscle soreness from last PT session. Pt informed that this is a normal process. Pt continues to require cues for pacing but has better activity tolerance. Pt able to complete all interventions this date.  Tight bilat hamstrings continues.   Pt will benefit from skilled therapeutic intervention in order to improve on the following deficits Decreased activity tolerance;Decreased mobility;Decreased  strength;Decreased range of motion;Difficulty walking;Impaired flexibility;Pain   Rehab Potential Good   PT Frequency 2x / week   PT Duration 8 weeks   PT Treatment/Interventions ADLs/Self Care Home Management;Electrical Stimulation;Moist Heat;Therapeutic exercise;Functional mobility training;Ultrasound;Traction;Patient/family education;Manual techniques   PT Next Visit Plan gym exercises and flexibility        Problem List Patient Active Problem List   Diagnosis Date Noted  . Erectile dysfunction of organic origin 07/15/2013  . DDD (degenerative disc disease), lumbosacral 07/11/2013  . DDD (degenerative disc disease), cervical 07/11/2013  . DJD (degenerative joint disease) of knee 07/11/2013    Scot Jun, PTA 05/22/2015, 11:08 AM  [address] [place], [place], [address] Phone: [phone]   Fax:  [phone]

## 2015-05-27 ENCOUNTER — Encounter: Payer: Self-pay | Admitting: Physical Therapy

## 2015-05-27 ENCOUNTER — Ambulatory Visit: Payer: 59 | Admitting: Physical Therapy

## 2015-05-27 DIAGNOSIS — M5440 Lumbago with sciatica, unspecified side: Secondary | ICD-10-CM

## 2015-05-27 DIAGNOSIS — M629 Disorder of muscle, unspecified: Secondary | ICD-10-CM

## 2015-05-27 DIAGNOSIS — R262 Difficulty in walking, not elsewhere classified: Secondary | ICD-10-CM

## 2015-05-27 NOTE — Therapy (Signed)
[place] [address] [place], [place], [address] Phone: [phone]   Fax:  [phone]  Physical Therapy Treatment  Patient Details  Name: Roger Wilcox MRN: [id number] Date of Birth: 27-Feb-1951 Referring Provider:  Starling Manns, MD  Encounter Date: 05/27/2015      PT End of Session - 05/27/15 1226    Visit Number 4   Date for PT Re-Evaluation 07/13/15   PT Start Time 0940   PT Stop Time 1240   PT Time Calculation (min) 55 min   Activity Tolerance Patient tolerated treatment well   Behavior During Therapy [facility] for tasks assessed/performed      Past Medical History  Diagnosis Date  . Constipation   . Arthritis     lumbar stenosis    Past Surgical History  Procedure Laterality Date  . Neck fusion    . Neck fusion  2003  . Neck fusion  1982  . Appendectomy    . Lumbar laminectomy/decompression microdiscectomy  10/11/2011    Procedure: LUMBAR LAMINECTOMY/DECOMPRESSION MICRODISCECTOMY 4 LEVEL;  Surgeon: Otilio Connors, MD;  Location: [place] [facility];  Service: Neurosurgery;  Laterality: N/A;  Lumbar Two to Sacral One Decompressive Laminectomy  . Quadriceps tendon repair  07/16/2012    Procedure: REPAIR QUADRICEP TENDON;  Surgeon: Wylene Simmer, MD;  Location: [facility];  Service: Orthopedics;  Laterality: Right;    There were no vitals filed for this visit.  Visit Diagnosis:  Difficulty walking  Midline low back pain with sciatica, sciatica laterality unspecified  Hamstring tightness of both lower extremities      Subjective Assessment - 05/27/15 1146    Subjective Pt reports that his hip and knees are doing a little better still has low back pain.   Patient Stated Goals be able to mow yard and have less pain   Currently in Pain? No/denies   Pain Score 0-No pain                         [facility] Adult PT Treatment/Exercise - 05/27/15 0001    Ambulation/Gait   Gait Comments Walked around  building with Pt. Pt unable to perform TKE with RLE during gait. decrease stance time on RLE. Pt reports that his knee fells like it is going to give out. Flex knee worsens with heel strike as pt fatigues. Pt does fatigues easily abd requires standing rest break.    Lumbar Exercises: Aerobic   Elliptical I-10 R-3 x3 min    Tread Mill NuStep level 5 x5 minutes   Lumbar Exercises: Machines for Strengthening   Cybex Knee Extension 35lb 3x10   Cybex Knee Flexion 45lb 3x10   Leg Press 80lb 3x15 , heel raises 70lb 30 reps    Other Lumbar Machine Exercise Seated rows #45 2x10, Lat pull downs #45 2x10   Lumbar Exercises: Standing   Row AROM;Strengthening;Power tower;Both;15 reps  2 sets    Row Limitations #45    Modalities   Modalities Moist Heat;Electrical Stimulation   Moist Heat Therapy   Number Minutes Moist Heat 15 Minutes   Moist Heat Location Lumbar Spine   Electrical Stimulation   Electrical Stimulation Location lumbar spine   Electrical Stimulation Action IFC   Electrical Stimulation Parameters to tolerance    Electrical Stimulation Goals Pain                  PT Short Term Goals - 05/19/15 1059  PT SHORT TERM GOAL #1   Title independent with initial HEP   Status Achieved           PT Long Term Goals - 05/22/15 1105    PT LONG TERM GOAL #1   Title decrease pain 50%   Status Partially Met   PT LONG TERM GOAL #3   Title increase SLR to 65 degrees bilaterally   Status On-going               Plan - 05/27/15 1228    Clinical Impression Statement Pt does have R quad weakness that affects Pt gait. Pt LE's fatigues easily after activity.    Pt will benefit from skilled therapeutic intervention in order to improve on the following deficits Decreased activity tolerance;Decreased mobility;Decreased strength;Decreased range of motion;Difficulty walking;Impaired flexibility;Pain   Rehab Potential Good   PT Frequency 2x / week   PT Treatment/Interventions  ADLs/Self Care Home Management;Electrical Stimulation;Moist Heat;Therapeutic exercise;Functional mobility training;Ultrasound;Traction;Patient/family education;Manual techniques   PT Next Visit Plan gym exercises and flexibility        Problem List Patient Active Problem List   Diagnosis Date Noted  . Erectile dysfunction of organic origin 07/15/2013  . DDD (degenerative disc disease), lumbosacral 07/11/2013  . DDD (degenerative disc disease), cervical 07/11/2013  . DJD (degenerative joint disease) of knee 07/11/2013    Scot Jun, PTA  05/27/2015, 12:31 PM  [address] [place], [place], [address] Phone: [phone]   Fax:  [phone]

## 2015-06-02 ENCOUNTER — Ambulatory Visit: Payer: 59 | Admitting: Physical Therapy

## 2015-06-02 ENCOUNTER — Encounter: Payer: Self-pay | Admitting: Physical Therapy

## 2015-06-02 DIAGNOSIS — M629 Disorder of muscle, unspecified: Secondary | ICD-10-CM

## 2015-06-02 DIAGNOSIS — M5440 Lumbago with sciatica, unspecified side: Secondary | ICD-10-CM

## 2015-06-02 DIAGNOSIS — R262 Difficulty in walking, not elsewhere classified: Secondary | ICD-10-CM

## 2015-06-02 NOTE — Therapy (Signed)
[place] [address] [place], [place], [address] Phone: [phone]   Fax:  [phone]  Physical Therapy Treatment  Patient Details  Name: Roger Wilcox MRN: [id number] Date of Birth: 1951/05/17 No Data Recorded  Encounter Date: 06/02/2015      PT End of Session - 06/02/15 1141    Visit Number 5   Date for PT Re-Evaluation 07/13/15   PT Start Time 1040   PT Stop Time 1150   PT Time Calculation (min) 70 min   Activity Tolerance Patient tolerated treatment well   Behavior During Therapy [facility] for tasks assessed/performed      Past Medical History  Diagnosis Date  . Constipation   . Arthritis     lumbar stenosis    Past Surgical History  Procedure Laterality Date  . Neck fusion    . Neck fusion  2003  . Neck fusion  1982  . Appendectomy    . Lumbar laminectomy/decompression microdiscectomy  10/11/2011    Procedure: LUMBAR LAMINECTOMY/DECOMPRESSION MICRODISCECTOMY 4 LEVEL;  Surgeon: Otilio Connors, MD;  Location: [place] [facility];  Service: Neurosurgery;  Laterality: N/A;  Lumbar Two to Sacral One Decompressive Laminectomy  . Quadriceps tendon repair  07/16/2012    Procedure: REPAIR QUADRICEP TENDON;  Surgeon: Wylene Simmer, MD;  Location: [facility];  Service: Orthopedics;  Laterality: Right;    There were no vitals filed for this visit.  Visit Diagnosis:  Difficulty walking  Midline low back pain with sciatica, sciatica laterality unspecified  Hamstring tightness of both lower extremities                       [facility] Adult PT Treatment/Exercise - 06/02/15 0001    Lumbar Exercises: Stretches   Passive Hamstring Stretch 3 reps;30 seconds   Quad Stretch 20 seconds;4 reps   ITB Stretch 20 seconds;4 reps   Piriformis Stretch 30 seconds;4 reps   Lumbar Exercises: Aerobic   Elliptical I-10 R-3 x3 min    Tread Mill NuStep level 5 x5 minutes   Lumbar Exercises: Machines for Strengthening   Other  Lumbar Machine Exercise Seated rows #45 2x10, Lat pull downs #45 2x10   Other Lumbar Machine Exercise 10# obliques, went over with patient how to use t-band at home to simulate   Lumbar Exercises: Sidelying   Clam 20 reps   Hip Abduction 20 reps   Moist Heat Therapy   Number Minutes Moist Heat 15 Minutes   Moist Heat Location Lumbar Spine   Electrical Stimulation   Electrical Stimulation Location lumbar spine   Electrical Stimulation Action IFC   Electrical Stimulation Parameters tolerance   Electrical Stimulation Goals Pain                  PT Short Term Goals - 05/19/15 1059    PT SHORT TERM GOAL #1   Title independent with initial HEP   Status Achieved           PT Long Term Goals - 06/02/15 1142    PT LONG TERM GOAL #1   Title decrease pain 50%   Status Partially Met   PT LONG TERM GOAL #2   Title increase lumbar ROM 25%   Status On-going               Plan - 06/02/15 1142    Clinical Impression Statement Patient with weakness and tightness in the LE's, especially noted the weakness in  the hips today.   PT Next Visit Plan gym exercises and flexibility   Consulted and Agree with Plan of Care Patient        Problem List Patient Active Problem List   Diagnosis Date Noted  . Erectile dysfunction of organic origin 07/15/2013  . DDD (degenerative disc disease), lumbosacral 07/11/2013  . DDD (degenerative disc disease), cervical 07/11/2013  . DJD (degenerative joint disease) of knee 07/11/2013    Sumner Boast., PT 06/02/2015, 11:43 AM  [address] [place], [place], [address] Phone: [phone]   Fax:  [phone]  Name: Roger Wilcox MRN: [id number] Date of Birth: [birth year]

## 2015-06-06 ENCOUNTER — Ambulatory Visit: Payer: 59 | Admitting: Physical Therapy

## 2015-06-06 ENCOUNTER — Encounter: Payer: Self-pay | Admitting: Physical Therapy

## 2015-06-06 DIAGNOSIS — M5440 Lumbago with sciatica, unspecified side: Secondary | ICD-10-CM

## 2015-06-06 DIAGNOSIS — M629 Disorder of muscle, unspecified: Secondary | ICD-10-CM

## 2015-06-06 DIAGNOSIS — R262 Difficulty in walking, not elsewhere classified: Secondary | ICD-10-CM

## 2015-06-06 NOTE — Therapy (Signed)
[facility] [address] [place], [place], [address] Phone: [phone]   Fax:  [phone]  Physical Therapy Treatment  Patient Details  Name: Roger Wilcox MRN: [id number] Date of Birth: 11/06/1950 No Data Recorded  Encounter Date: 06/06/2015      PT End of Session - 06/06/15 1055    Visit Number 6   PT Start Time 1013   PT Stop Time 1110   PT Time Calculation (min) 57 min   Activity Tolerance Patient tolerated treatment well   Behavior During Therapy [facility] for tasks assessed/performed      Past Medical History  Diagnosis Date  . Constipation   . Arthritis     lumbar stenosis    Past Surgical History  Procedure Laterality Date  . Neck fusion    . Neck fusion  2003  . Neck fusion  1982  . Appendectomy    . Lumbar laminectomy/decompression microdiscectomy  10/11/2011    Procedure: LUMBAR LAMINECTOMY/DECOMPRESSION MICRODISCECTOMY 4 LEVEL;  Surgeon: James R Hirsch, MD;  Location: [facility];  Service: Neurosurgery;  Laterality: N/A;  Lumbar Two to Sacral One Decompressive Laminectomy  . Quadriceps tendon repair  07/16/2012    Procedure: REPAIR QUADRICEP TENDON;  Surgeon: Coleby Hewitt, MD;  Location: [facility];  Service: Orthopedics;  Laterality: Right;    There were no vitals filed for this visit.  Visit Diagnosis:  Midline low back pain with sciatica, sciatica laterality unspecified  Hamstring tightness of both lower extremities  Difficulty walking      Subjective Assessment - 06/06/15 1013    Subjective Pt reports that his legs feel good but cramps occasionally, back is more sore than normal, legs feel stronger   Currently in Pain? Yes   Pain Score 3    Pain Location Back   Pain Descriptors / Indicators Aching                         [facility] Adult PT Treatment/Exercise - 06/06/15 0001    High Level Balance   High Level Balance Comments Resisted gait all directions.   Lumbar Exercises:  Aerobic   Tread Mill NuStep level 5 x5 minutes   Lumbar Exercises: Machines for Strengthening   Cybex Knee Extension 25lb 3x10   Cybex Knee Flexion 35lb 2x15   Leg Press 70lb 3x15 , heel raises 70lb 30 reps    Other Lumbar Machine Exercise Seated rows #45 2x15, Lat pull downs #45 2x15   Other Lumbar Machine Exercise Back on wall shoulder flex blue weighted ball  & obliques x15 each    Lumbar Exercises: Standing   Other Standing Lumbar Exercises Hip ext x20R x15L each blue Tband    Lumbar Exercises: Seated   Sit to Stand 15 reps  2 sets holding blue weighted ball                  PT Short Term Goals - 05/19/15 1059    PT SHORT TERM GOAL #1   Title independent with initial HEP   Status Achieved           PT Long Term Goals - 06/02/15 1142    PT LONG TERM GOAL #1   Title decrease pain 50%   Status Partially Met   PT LONG TERM GOAL #2   Title increase lumbar ROM 25%   Status On-going                 Plan - 06/06/15 1057    Clinical Impression Statement Continues with weak LE's R>L, unable to establish TKE when ambulating. Able to progress to resisted gait but unable to tolerate side step without balance issue.   Pt will benefit from skilled therapeutic intervention in order to improve on the following deficits Decreased activity tolerance;Decreased mobility;Decreased strength;Decreased range of motion;Difficulty walking;Impaired flexibility;Pain   Rehab Potential Good   PT Frequency 2x / week   PT Treatment/Interventions ADLs/Self Care Home Management;Electrical Stimulation;Moist Heat;Therapeutic exercise;Functional mobility training;Ultrasound;Traction;Patient/family education;Manual techniques   PT Next Visit Plan gym exercises and flexibility        Problem List Patient Active Problem List   Diagnosis Date Noted  . Erectile dysfunction of organic origin 07/15/2013  . DDD (degenerative disc disease), lumbosacral 07/11/2013  . DDD (degenerative disc  disease), cervical 07/11/2013  . DJD (degenerative joint disease) of knee 07/11/2013    Ronald G Pemberton, PTA  06/06/2015, 11:00 AM  [facility] [address] [place], [place], [address] Phone: [phone]   Fax:  [phone]  Name: Ramar M Ivan MRN: [id number] Date of Birth: 08/31/1950     

## 2015-06-13 ENCOUNTER — Encounter: Payer: Self-pay | Admitting: Physical Therapy

## 2015-06-13 ENCOUNTER — Ambulatory Visit: Payer: 59 | Admitting: Physical Therapy

## 2015-06-13 DIAGNOSIS — M5440 Lumbago with sciatica, unspecified side: Secondary | ICD-10-CM

## 2015-06-13 DIAGNOSIS — M629 Disorder of muscle, unspecified: Secondary | ICD-10-CM

## 2015-06-13 DIAGNOSIS — R262 Difficulty in walking, not elsewhere classified: Secondary | ICD-10-CM

## 2015-06-13 NOTE — Therapy (Signed)
[address] [place], [place], [address] Phone: [phone]   Fax:  [phone]  Physical Therapy Treatment  Patient Details  Name: Roger Wilcox MRN: [id number] Date of Birth: [birth year] No Data Recorded  Encounter Date: 06/13/2015      PT End of Session - 06/13/15 0958    Visit Number 7   Date for PT Re-Evaluation 07/13/15   PT Start Time 0915   PT Stop Time 0958   PT Time Calculation (min) 43 min   Activity Tolerance Patient tolerated treatment well   Behavior During Therapy [facility] for tasks assessed/performed      Past Medical History  Diagnosis Date  . Constipation   . Arthritis     lumbar stenosis    Past Surgical History  Procedure Laterality Date  . Neck fusion    . Neck fusion  2003  . Neck fusion  1982  . Appendectomy    . Lumbar laminectomy/decompression microdiscectomy  10/11/2011    Procedure: LUMBAR LAMINECTOMY/DECOMPRESSION MICRODISCECTOMY 4 LEVEL;  Surgeon: Otilio Connors, MD;  Location: [place] [facility];  Service: Neurosurgery;  Laterality: N/A;  Lumbar Two to Sacral One Decompressive Laminectomy  . Quadriceps tendon repair  07/16/2012    Procedure: REPAIR QUADRICEP TENDON;  Surgeon: Wylene Simmer, MD;  Location: [facility];  Service: Orthopedics;  Laterality: Right;    There were no vitals filed for this visit.  Visit Diagnosis:  Hamstring tightness of both lower extremities  Difficulty walking  Midline low back pain with sciatica, sciatica laterality unspecified      Subjective Assessment - 06/13/15 0916    Subjective Pt reports getting an injection monday and reports that this is the first time he has not has any LBP. Not taken a pain pill since sunday. MD wants him to take it easy    Currently in Pain? No/denies   Pain Score 0-No pain                         [facility] Adult PT Treatment/Exercise - 06/13/15 0001    Ambulation/Gait   Gait Comments 2 flights of stairs  focusing on control on ascends and descends with RLE    Lumbar Exercises: Aerobic   Elliptical I-10 R-3 x3 min    Lumbar Exercises: Machines for Strengthening   Cybex Knee Extension RLE #10 2x10    Cybex Knee Flexion 35lb 2x15   Leg Press 60lb 2x15 , RLE only #20 x15, heel raises 70lb 30 reps    Lumbar Exercises: Standing   Other Standing Lumbar Exercises TKE Blue Tband 2x15   Lumbar Exercises: Supine   Other Supine Lumbar Exercises Quad set 3 sec x10 RLE   Other Supine Lumbar Exercises SLR with ER RLE 2x10                   PT Short Term Goals - 05/19/15 1059    PT SHORT TERM GOAL #1   Title independent with initial HEP   Status Achieved           PT Long Term Goals - 06/02/15 1142    PT LONG TERM GOAL #1   Title decrease pain 50%   Status Partially Met   PT LONG TERM GOAL #2   Title increase lumbar ROM 25%   Status On-going               Plan - 06/13/15 3295  Clinical Impression Statement Pt reports that his back is pain free after injection Monday. Reports that MD was concern about pt inability to establish TKE when ambulating and it possibly being to primary  cause of LBP. Today's treatment focuses on LE strengthening  with some interventions isolating RLE. Pt able to completa all interventions but LE does fatigue easily.   Pt will benefit from skilled therapeutic intervention in order to improve on the following deficits Decreased activity tolerance;Decreased mobility;Decreased strength;Decreased range of motion;Difficulty walking;Impaired flexibility;Pain   Rehab Potential Good   PT Frequency 2x / week   PT Duration 8 weeks   PT Treatment/Interventions ADLs/Self Care Home Management;Electrical Stimulation;Moist Heat;Therapeutic exercise;Functional mobility training;Ultrasound;Traction;Patient/family education;Manual techniques   PT Next Visit Plan gym exercises and flexibility        Problem List Patient Active Problem List   Diagnosis Date  Noted  . Erectile dysfunction of organic origin 07/15/2013  . DDD (degenerative disc disease), lumbosacral 07/11/2013  . DDD (degenerative disc disease), cervical 07/11/2013  . DJD (degenerative joint disease) of knee 07/11/2013    Scot Jun, PTA  06/13/2015, 10:03 AM  [place] [address] [place], [place], [address] Phone: [phone]   Fax:  [phone]  Name: Roger Wilcox MRN: [id number] Date of Birth: 1951/01/05

## 2015-06-24 ENCOUNTER — Telehealth: Payer: Self-pay

## 2015-06-24 ENCOUNTER — Ambulatory Visit: Payer: 59 | Admitting: Physical Therapy

## 2015-06-24 NOTE — Telephone Encounter (Signed)
06/24/15 patient cxl PT appt, had injection yesterday will keep Thurs appt

## 2015-06-26 ENCOUNTER — Ambulatory Visit: Payer: 59 | Attending: Orthopaedic Surgery | Admitting: Physical Therapy

## 2015-06-26 ENCOUNTER — Encounter: Payer: Self-pay | Admitting: Physical Therapy

## 2015-06-26 DIAGNOSIS — R262 Difficulty in walking, not elsewhere classified: Secondary | ICD-10-CM | POA: Diagnosis present

## 2015-06-26 DIAGNOSIS — M629 Disorder of muscle, unspecified: Secondary | ICD-10-CM | POA: Insufficient documentation

## 2015-06-26 DIAGNOSIS — M5440 Lumbago with sciatica, unspecified side: Secondary | ICD-10-CM | POA: Diagnosis present

## 2015-06-26 NOTE — Therapy (Signed)
[address] [place] [address] [place], [place], [address] Phone: [phone]   Fax:  [phone]  Physical Therapy Treatment  Patient Details  Name: Roger Wilcox MRN: [id number] Date of Birth: 09/27/1950 No Data Recorded  Encounter Date: 06/26/2015      PT End of Session - 06/26/15 1217    Visit Number 8   Date for PT Re-Evaluation 07/13/15   PT Start Time 1143   PT Stop Time 1232   PT Time Calculation (min) 49 min   Activity Tolerance Patient tolerated treatment well   Behavior During Therapy [facility] for tasks assessed/performed      Past Medical History  Diagnosis Date  . Constipation   . Arthritis     lumbar stenosis    Past Surgical History  Procedure Laterality Date  . Neck fusion    . Neck fusion  2003  . Neck fusion  1982  . Appendectomy    . Lumbar laminectomy/decompression microdiscectomy  10/11/2011    Procedure: LUMBAR LAMINECTOMY/DECOMPRESSION MICRODISCECTOMY 4 LEVEL;  Surgeon: Otilio Connors, MD;  Location: [place] [facility];  Service: Neurosurgery;  Laterality: N/A;  Lumbar Two to Sacral One Decompressive Laminectomy  . Quadriceps tendon repair  07/16/2012    Procedure: REPAIR QUADRICEP TENDON;  Surgeon: Wylene Simmer, MD;  Location: [facility];  Service: Orthopedics;  Laterality: Right;    There were no vitals filed for this visit.  Visit Diagnosis:  Midline low back pain with sciatica, sciatica laterality unspecified  Difficulty walking  Hamstring tightness of both lower extremities      Subjective Assessment - 06/26/15 1143    Subjective Pt reports that he has an injection in his back this Monday but it didn't help like the first one did. Pt stated that MD want him to exercise is legs but not the back   Currently in Pain? Yes   Pain Score 4    Pain Location Back   Pain Type Chronic pain                         [facility] Adult PT Treatment/Exercise - 06/26/15 0001    Lumbar Exercises:  Aerobic   Tread Mill NuStep level 5 x6 minutes   Lumbar Exercises: Machines for Strengthening   Cybex Knee Extension RLE #10 2x10    Cybex Knee Flexion 35lb 2x15   Leg Press 60lb 2x15 , RLE only #20 x15, heel raises 60lb 30 reps    Lumbar Exercises: Standing   Other Standing Lumbar Exercises TKE Blue Tband 2x15   Other Standing Lumbar Exercises Hip add/abd manual  resistance 2x15; Ball squeeze x20, Seated march #5 3x10   Modalities   Modalities Moist Heat;Electrical Stimulation   Moist Heat Therapy   Number Minutes Moist Heat 15 Minutes   Moist Heat Location Lumbar Spine   Electrical Stimulation   Electrical Stimulation Location lumbar spine   Electrical Stimulation Action IFC   Electrical Stimulation Parameters tolerance    Electrical Stimulation Goals Pain                  PT Short Term Goals - 05/19/15 1059    PT SHORT TERM GOAL #1   Title independent with initial HEP   Status Achieved           PT Long Term Goals - 06/02/15 1142    PT LONG TERM GOAL #1   Title decrease pain 50%  Status Partially Met   PT LONG TERM GOAL #2   Title increase lumbar ROM 25%   Status On-going               Plan - 06/26/15 1219    Clinical Impression Statement Pt reports getting another injection this past Monday but it didn't help like the first one. Pt stated the MD only wanted him to work on his legs and not to do anything to stress his back. Return to MD after thanksgiving. Tolerated all LE exercises well this date. Reports that his legs felt looser post treatment.   Pt will benefit from skilled therapeutic intervention in order to improve on the following deficits Decreased activity tolerance;Decreased mobility;Decreased strength;Decreased range of motion;Difficulty walking;Impaired flexibility;Pain   Rehab Potential Good   PT Frequency 2x / week   PT Duration 8 weeks   PT Treatment/Interventions ADLs/Self Care Home Management;Electrical Stimulation;Moist  Heat;Therapeutic exercise;Functional mobility training;Ultrasound;Traction;Patient/family education;Manual techniques   PT Next Visit Plan gym exercises and flexibility        Problem List Patient Active Problem List   Diagnosis Date Noted  . Erectile dysfunction of organic origin 07/15/2013  . DDD (degenerative disc disease), lumbosacral 07/11/2013  . DDD (degenerative disc disease), cervical 07/11/2013  . DJD (degenerative joint disease) of knee 07/11/2013    Scot Jun, PTA  06/26/2015, 12:22 PM  [address] [place], [place], [address] Phone: [phone]   Fax:  [phone]  Name: Roger Wilcox MRN: [id number] Date of Birth: Jan 27, 1951

## 2015-07-01 ENCOUNTER — Ambulatory Visit: Payer: 59 | Admitting: Physical Therapy

## 2015-07-01 ENCOUNTER — Encounter: Payer: Self-pay | Admitting: Physical Therapy

## 2015-07-01 DIAGNOSIS — R262 Difficulty in walking, not elsewhere classified: Secondary | ICD-10-CM

## 2015-07-01 DIAGNOSIS — M5440 Lumbago with sciatica, unspecified side: Secondary | ICD-10-CM

## 2015-07-01 DIAGNOSIS — M629 Disorder of muscle, unspecified: Secondary | ICD-10-CM

## 2015-07-01 NOTE — Therapy (Signed)
[place] [address] [place], [place], [address] Phone: [phone]   Fax:  [phone]  Physical Therapy Treatment  Patient Details  Name: Roger Wilcox MRN: [id number] Date of Birth: 1951-07-04 No Data Recorded  Encounter Date: 07/01/2015      PT End of Session - 07/01/15 1047    Visit Number 9   Date for PT Re-Evaluation 07/13/15   PT Start Time 4268   PT Stop Time 1101   PT Time Calculation (min) 46 min   Activity Tolerance Patient tolerated treatment well   Behavior During Therapy [facility] for tasks assessed/performed      Past Medical History  Diagnosis Date  . Constipation   . Arthritis     lumbar stenosis    Past Surgical History  Procedure Laterality Date  . Neck fusion    . Neck fusion  2003  . Neck fusion  1982  . Appendectomy    . Lumbar laminectomy/decompression microdiscectomy  10/11/2011    Procedure: LUMBAR LAMINECTOMY/DECOMPRESSION MICRODISCECTOMY 4 LEVEL;  Surgeon: Otilio Connors, MD;  Location: [place] [facility];  Service: Neurosurgery;  Laterality: N/A;  Lumbar Two to Sacral One Decompressive Laminectomy  . Quadriceps tendon repair  07/16/2012    Procedure: REPAIR QUADRICEP TENDON;  Surgeon: Wylene Simmer, MD;  Location: [facility];  Service: Orthopedics;  Laterality: Right;    There were no vitals filed for this visit.  Visit Diagnosis:  Hamstring tightness of both lower extremities  Difficulty walking  Midline low back pain with sciatica, sciatica laterality unspecified      Subjective Assessment - 07/01/15 1017    Subjective Pt reports that his back remains tight and still, but his legs are feeling better. "I also have pain in my low back." Pt returns to MD this Thursday, possibly get another injection the week after next.   Currently in Pain? Yes   Pain Score 3    Pain Location Back   Pain Orientation Left                         [facility] Adult PT Treatment/Exercise -  07/01/15 0001    Lumbar Exercises: Aerobic   Tread Mill NuStep level 5 x7 minutes   Lumbar Exercises: Machines for Strengthening   Cybex Knee Extension #20 2x15; RLE #5 2x10    Cybex Knee Flexion 45lb 2x15   Leg Press 60lb 2x15 , RLE only #20 x15, heel raises 60lb 30 reps    Lumbar Exercises: Standing   Other Standing Lumbar Exercises Hip add/abd manual  resistance 2x15; Ball squeeze x20, Seated march #5 2x15   Modalities   Modalities Moist Heat;Electrical Stimulation   Moist Heat Therapy   Number Minutes Moist Heat 15 Minutes   Moist Heat Location Lumbar Spine   Electrical Stimulation   Electrical Stimulation Location lumbar spine   Electrical Stimulation Action IFC   Electrical Stimulation Parameters tolerance    Electrical Stimulation Goals Pain                  PT Short Term Goals - 05/19/15 1059    PT SHORT TERM GOAL #1   Title independent with initial HEP   Status Achieved           PT Long Term Goals - 06/02/15 1142    PT LONG TERM GOAL #1   Title decrease pain 50%   Status Partially Met   PT LONG  TERM GOAL #2   Title increase lumbar ROM 25%   Status On-going               Plan - 07/01/15 1047    Clinical Impression Statement Remains on conservative treatment plan due to MD wished per pt report. Pt will see the MD Thursday for his low back pain. Again today's treatment focused on LE strengthening avoiding anything that will stress the low back. Pt able to tolerate the LE interventions with increases weight and repetitions without issues.   Pt will benefit from skilled therapeutic intervention in order to improve on the following deficits Decreased activity tolerance;Decreased mobility;Decreased strength;Decreased range of motion;Difficulty walking;Impaired flexibility;Pain   Rehab Potential Good   PT Frequency 2x / week   PT Duration 8 weeks   PT Treatment/Interventions ADLs/Self Care Home Management;Electrical Stimulation;Moist Heat;Therapeutic  exercise;Functional mobility training;Ultrasound;Traction;Patient/family education;Manual techniques   PT Next Visit Plan LE strengthening         Problem List Patient Active Problem List   Diagnosis Date Noted  . Erectile dysfunction of organic origin 07/15/2013  . DDD (degenerative disc disease), lumbosacral 07/11/2013  . DDD (degenerative disc disease), cervical 07/11/2013  . DJD (degenerative joint disease) of knee 07/11/2013    Scot Jun, PTA  07/01/2015, 10:50 AM  [address] [place], [place], [address] Phone: [phone]   Fax:  [phone]  Name: Roger Wilcox MRN: [id number] Date of Birth: [birth year]

## 2015-07-03 ENCOUNTER — Ambulatory Visit: Payer: 59 | Admitting: Physical Therapy

## 2015-07-09 ENCOUNTER — Ambulatory Visit: Payer: 59 | Admitting: Physical Therapy

## 2015-07-09 ENCOUNTER — Encounter: Payer: Self-pay | Admitting: Physical Therapy

## 2015-07-09 DIAGNOSIS — M5440 Lumbago with sciatica, unspecified side: Secondary | ICD-10-CM | POA: Diagnosis not present

## 2015-07-09 DIAGNOSIS — M629 Disorder of muscle, unspecified: Secondary | ICD-10-CM

## 2015-07-09 DIAGNOSIS — R262 Difficulty in walking, not elsewhere classified: Secondary | ICD-10-CM

## 2015-07-09 NOTE — Therapy (Signed)
[address] [place], [place], [address] Phone: [phone]   Fax:  [phone]  Physical Therapy Treatment  Patient Details  Name: BARNABAS HENRIQUES MRN: [id number] Date of Birth: 18-Feb-1951 No Data Recorded  Encounter Date: 07/09/2015      PT End of Session - 07/09/15 1053    Visit Number 10   Date for PT Re-Evaluation 07/13/15   PT Start Time 1013   PT Stop Time 1108   PT Time Calculation (min) 55 min   Activity Tolerance Patient tolerated treatment well   Behavior During Therapy [facility] for tasks assessed/performed      Past Medical History  Diagnosis Date  . Constipation   . Arthritis     lumbar stenosis    Past Surgical History  Procedure Laterality Date  . Neck fusion    . Neck fusion  2003  . Neck fusion  1982  . Appendectomy    . Lumbar laminectomy/decompression microdiscectomy  10/11/2011    Procedure: LUMBAR LAMINECTOMY/DECOMPRESSION MICRODISCECTOMY 4 LEVEL;  Surgeon: Otilio Connors, MD;  Location: [place] [facility];  Service: Neurosurgery;  Laterality: N/A;  Lumbar Two to Sacral One Decompressive Laminectomy  . Quadriceps tendon repair  07/16/2012    Procedure: REPAIR QUADRICEP TENDON;  Surgeon: Wylene Simmer, MD;  Location: [facility];  Service: Orthopedics;  Laterality: Right;    There were no vitals filed for this visit.  Visit Diagnosis:  Difficulty walking  Midline low back pain with sciatica, sciatica laterality unspecified  Hamstring tightness of both lower extremities      Subjective Assessment - 07/09/15 1013    Subjective Back still fairly sore but his legs feel better. Returns to MD Monday for another injection in back.    Currently in Pain? No/denies   Pain Score 0-No pain                         [facility] Adult PT Treatment/Exercise - 07/09/15 0001    Lumbar Exercises: Aerobic   Stationary Bike L2 x7 min    Lumbar Exercises: Machines for Strengthening   Cybex Knee  Extension #20 2x15; RLE #5 2x10    Cybex Knee Flexion 45lb 3x15   Leg Press 60lb 3x15 , RLE only #20 x15, heel raises 60lb 30 reps    Lumbar Exercises: Standing   Other Standing Lumbar Exercises TKE Black Tband 2x15   Other Standing Lumbar Exercises Hip add/abd manual resistance 2x15; Ball squeeze x20, Seated march #5 2x15   Modalities   Modalities Moist Heat;Electrical Stimulation   Moist Heat Therapy   Number Minutes Moist Heat 15 Minutes   Moist Heat Location Lumbar Spine   Electrical Stimulation   Electrical Stimulation Location lumbar spine   Electrical Stimulation Action IFC   Electrical Stimulation Parameters tolerance    Electrical Stimulation Goals Pain                  PT Short Term Goals - 05/19/15 1059    PT SHORT TERM GOAL #1   Title independent with initial HEP   Status Achieved           PT Long Term Goals - 06/02/15 1142    PT LONG TERM GOAL #1   Title decrease pain 50%   Status Partially Met   PT LONG TERM GOAL #2   Title increase lumbar ROM 25%   Status On-going  Plan - 07/09/15 1054    Clinical Impression Statement Pt reports that he has to get another injection in his back Monday. MD reports that he should remain conservative during PT not stressing the back ck area. Lacks TKE ext with RLE  causing a limp when walking. Tolerated all LE exercises well.     Pt will benefit from skilled therapeutic intervention in order to improve on the following deficits Decreased activity tolerance;Decreased mobility;Decreased strength;Decreased range of motion;Difficulty walking;Impaired flexibility;Pain   Rehab Potential Good   PT Frequency 2x / week   PT Duration 8 weeks   PT Treatment/Interventions ADLs/Self Care Home Management;Electrical Stimulation;Moist Heat;Therapeutic exercise;Functional mobility training;Ultrasound;Traction;Patient/family education;Manual techniques   PT Next Visit Plan Get report from MD         Problem  List Patient Active Problem List   Diagnosis Date Noted  . Erectile dysfunction of organic origin 07/15/2013  . DDD (degenerative disc disease), lumbosacral 07/11/2013  . DDD (degenerative disc disease), cervical 07/11/2013  . DJD (degenerative joint disease) of knee 07/11/2013    Scot Jun, PTA  07/09/2015, 10:58 AM  [address] [place], [place], [address] Phone: [phone]   Fax:  [phone]  Name: LEVERETT CAMPLIN MRN: [id number] Date of Birth: [birth year]

## 2015-07-17 ENCOUNTER — Encounter: Payer: Self-pay | Admitting: Physical Therapy

## 2015-07-17 ENCOUNTER — Ambulatory Visit: Payer: 59 | Attending: Orthopaedic Surgery | Admitting: Physical Therapy

## 2015-07-17 DIAGNOSIS — R262 Difficulty in walking, not elsewhere classified: Secondary | ICD-10-CM | POA: Diagnosis present

## 2015-07-17 DIAGNOSIS — M5440 Lumbago with sciatica, unspecified side: Secondary | ICD-10-CM

## 2015-07-17 DIAGNOSIS — M629 Disorder of muscle, unspecified: Secondary | ICD-10-CM

## 2015-07-17 NOTE — Therapy (Signed)
[place] [address] [place], [place], [address] Phone: [phone]   Fax:  [phone]  Physical Therapy Treatment  Patient Details  Name: QUAYSHAWN NIN MRN: [id number] Date of Birth: Aug 11, 1951 No Data Recorded  Encounter Date: 07/17/2015      PT End of Session - 07/17/15 1044    Visit Number 11   Date for PT Re-Evaluation 08/11/15   PT Start Time 1001   PT Stop Time 1100   PT Time Calculation (min) 59 min   Activity Tolerance Patient tolerated treatment well   Behavior During Therapy [facility] for tasks assessed/performed      Past Medical History  Diagnosis Date  . Constipation   . Arthritis     lumbar stenosis    Past Surgical History  Procedure Laterality Date  . Neck fusion    . Neck fusion  2003  . Neck fusion  1982  . Appendectomy    . Lumbar laminectomy/decompression microdiscectomy  10/11/2011    Procedure: LUMBAR LAMINECTOMY/DECOMPRESSION MICRODISCECTOMY 4 LEVEL;  Surgeon: Otilio Connors, MD;  Location: [place] [facility];  Service: Neurosurgery;  Laterality: N/A;  Lumbar Two to Sacral One Decompressive Laminectomy  . Quadriceps tendon repair  07/16/2012    Procedure: REPAIR QUADRICEP TENDON;  Surgeon: Wylene Simmer, MD;  Location[facility];  Service: Orthopedics;  Laterality: Right;    There were no vitals filed for this visit.  Visit Diagnosis:  Hamstring tightness of both lower extremities  Midline low back pain with sciatica, sciatica laterality unspecified  Difficulty walking      Subjective Assessment - 07/17/15 1004    Subjective Pt received an injection Monday in lower back. MD want to focus on LE strengthening and not stressing low back.   Currently in Pain? No/denies   Pain Score 0-No pain                         [facility] Adult PT Treatment/Exercise - 07/17/15 0001    Lumbar Exercises: Aerobic   Tread Mill NuStep level 5 x7 minutes   Lumbar Exercises: Machines for  Strengthening   Cybex Knee Extension #25 2x15; RLE #10 x10 #5 x10    Cybex Knee Flexion 45lb 3x15   Leg Press 70lb 3x10 , RLE only #20 x15, heel raises 60lb 30 reps    Lumbar Exercises: Standing   Other Standing Lumbar Exercises TKE Black Tband 2x10 3sec hold   Other Standing Lumbar Exercises Hip add/abd black Tband  2x20; Ball squeeze 2x20, Seated march #5 2x20                  PT Short Term Goals - 05/19/15 1059    PT SHORT TERM GOAL #1   Title independent with initial HEP   Status Achieved           PT Long Term Goals - 06/02/15 1142    PT LONG TERM GOAL #1   Title decrease pain 50%   Status Partially Met   PT LONG TERM GOAL #2   Title increase lumbar ROM 25%   Status On-going               Plan - 07/17/15 1045    Clinical Impression Statement Again only LE strengthen, Pt reports having another injection that past Monday and will return to MD in three weeks. Completed all interventions this date with additional resistance. Continues to have issues with LLE  establishing TKE when ambulating.    Pt will benefit from skilled therapeutic intervention in order to improve on the following deficits Decreased activity tolerance;Decreased mobility;Decreased strength;Decreased range of motion;Difficulty walking;Impaired flexibility;Pain   Rehab Potential Good   PT Frequency 2x / week   PT Duration 8 weeks   PT Treatment/Interventions ADLs/Self Care Home Management;Electrical Stimulation;Moist Heat;Therapeutic exercise;Functional mobility training;Ultrasound;Traction;Patient/family education;Manual techniques   PT Next Visit Plan LE strengthening        Problem List Patient Active Problem List   Diagnosis Date Noted  . Erectile dysfunction of organic origin 07/15/2013  . DDD (degenerative disc disease), lumbosacral 07/11/2013  . DDD (degenerative disc disease), cervical 07/11/2013  . DJD (degenerative joint disease) of knee 07/11/2013    Scot Jun, PTA  07/17/2015, 10:48 AM  [address] [place], [place], [address] Phone: [phone]   Fax:  [phone]  Name: TYRESS LODEN MRN: [id number] Date of Birth: 08-11-1951

## 2015-07-22 ENCOUNTER — Encounter: Payer: Self-pay | Admitting: Physical Therapy

## 2015-07-22 ENCOUNTER — Ambulatory Visit: Payer: 59 | Admitting: Physical Therapy

## 2015-07-22 DIAGNOSIS — M629 Disorder of muscle, unspecified: Secondary | ICD-10-CM

## 2015-07-22 DIAGNOSIS — M5440 Lumbago with sciatica, unspecified side: Secondary | ICD-10-CM

## 2015-07-22 DIAGNOSIS — R262 Difficulty in walking, not elsewhere classified: Secondary | ICD-10-CM

## 2015-07-22 NOTE — Therapy (Signed)
[place] [address] [place], [place], [address] Phone: [phone]   Fax:  [phone]  Physical Therapy Treatment  Patient Details  Name: Roger Wilcox MRN: [id number] Date of Birth: 05-22-1951 No Data Recorded  Encounter Date: 07/22/2015      PT End of Session - 07/22/15 1133    Visit Number 12   Date for PT Re-Evaluation 08/11/15   PT Start Time 1040   PT Stop Time 1122   PT Time Calculation (min) 42 min   Activity Tolerance Patient tolerated treatment well   Behavior During Therapy [facility] for tasks assessed/performed      Past Medical History  Diagnosis Date  . Constipation   . Arthritis     lumbar stenosis    Past Surgical History  Procedure Laterality Date  . Neck fusion    . Neck fusion  2003  . Neck fusion  1982  . Appendectomy    . Lumbar laminectomy/decompression microdiscectomy  10/11/2011    Procedure: LUMBAR LAMINECTOMY/DECOMPRESSION MICRODISCECTOMY 4 LEVEL;  Surgeon: Otilio Connors, MD;  Location: [place] [facility];  Service: Neurosurgery;  Laterality: N/A;  Lumbar Two to Sacral One Decompressive Laminectomy  . Quadriceps tendon repair  07/16/2012    Procedure: REPAIR QUADRICEP TENDON;  Surgeon: Wylene Simmer, MD;  Location: [facility];  Service: Orthopedics;  Laterality: Right;    There were no vitals filed for this visit.  Visit Diagnosis:  Hamstring tightness of both lower extremities  Midline low back pain with sciatica, sciatica laterality unspecified  Difficulty walking      Subjective Assessment - 07/22/15 1052    Subjective Patient had a 3rd injection last Monday, again we will continue to work with him on LE strength and try not to stress the back.  He will see the MD Monday and may have a 4th injection.   Currently in Pain? Yes   Pain Score 2    Pain Location Back   Pain Orientation Lower   Pain Descriptors / Indicators Cramping   Pain Type Chronic pain   Aggravating Factors  bending  and standing   Pain Relieving Factors treatment, feeling stronger                         [facility] Adult PT Treatment/Exercise - 07/22/15 0001    Lumbar Exercises: Aerobic   Stationary Bike L2 x7 min    Lumbar Exercises: Machines for Strengthening   Cybex Knee Extension #20 2x15; RLE #10 x10 #5 x10    Cybex Knee Flexion 35lb 3x15   Leg Press 60lb 3x10 , RLE only #20 x15, heel raises 60lb 30 reps    Other Lumbar Machine Exercise Seated rows and lats 20#, straight arm pull downs 20# with focus on core contraction   Lumbar Exercises: Supine   Other Supine Lumbar Exercises feet on ball K2C, trunk rotation, small bridges and isometric abdominals   Manual Therapy   Manual therapy comments PROM of the LE's HS and piriformis and trunk rotation                  PT Short Term Goals - 05/19/15 1059    PT SHORT TERM GOAL #1   Title independent with initial HEP   Status Achieved           PT Long Term Goals - 07/22/15 1136    PT LONG TERM GOAL #1   Title decrease pain  50%   Status Partially Met   PT LONG TERM GOAL #3   Title increase SLR to 65 degrees bilaterally   Status Achieved               Plan - 07/22/15 1135    Clinical Impression Statement I went over with the patient that he is feeling better with the injections, but that is for inflammation, and that he will need to have increased flexibility, core strength and watch his body mechanics to assure that the inflammation does not return.   PT Next Visit Plan continue with LE flexibility and body mechanics   Consulted and Agree with Plan of Care Patient        Problem List Patient Active Problem List   Diagnosis Date Noted  . Erectile dysfunction of organic origin 07/15/2013  . DDD (degenerative disc disease), lumbosacral 07/11/2013  . DDD (degenerative disc disease), cervical 07/11/2013  . DJD (degenerative joint disease) of knee 07/11/2013    Sumner Boast., PT 07/22/2015, 11:37  AM  [address] [place], [place], [address] Phone: [phone]   Fax:  [phone]  Name: OSAZE HUBBERT MRN: [id number] Date of Birth: December 08, 1950

## 2015-07-24 ENCOUNTER — Ambulatory Visit: Payer: 59 | Admitting: Physical Therapy

## 2015-07-31 ENCOUNTER — Ambulatory Visit: Payer: 59 | Admitting: Physical Therapy

## 2015-07-31 ENCOUNTER — Encounter: Payer: Self-pay | Admitting: Physical Therapy

## 2015-07-31 DIAGNOSIS — M5440 Lumbago with sciatica, unspecified side: Secondary | ICD-10-CM

## 2015-07-31 DIAGNOSIS — R262 Difficulty in walking, not elsewhere classified: Secondary | ICD-10-CM

## 2015-07-31 DIAGNOSIS — M629 Disorder of muscle, unspecified: Secondary | ICD-10-CM

## 2015-07-31 NOTE — Therapy (Addendum)
[place] [address] [place], [place], [address] Phone: [phone]   Fax:  [phone]  Physical Therapy Treatment  Patient Details  Name: Roger Wilcox MRN: [id number] Date of Birth: 10/20/1950 No Data Recorded  Encounter Date: 07/31/2015      PT End of Session - 07/31/15 1055    Visit Number 13   Date for PT Re-Evaluation 08/11/15   PT Start Time 9791   PT Stop Time 1110   PT Time Calculation (min) 55 min   Activity Tolerance Patient tolerated treatment well   Behavior During Therapy [facility] for tasks assessed/performed      Past Medical History  Diagnosis Date  . Constipation   . Arthritis     lumbar stenosis    Past Surgical History  Procedure Laterality Date  . Neck fusion    . Neck fusion  2003  . Neck fusion  1982  . Appendectomy    . Lumbar laminectomy/decompression microdiscectomy  10/11/2011    Procedure: LUMBAR LAMINECTOMY/DECOMPRESSION MICRODISCECTOMY 4 LEVEL;  Surgeon: Otilio Connors, MD;  Location: [place] [facility];  Service: Neurosurgery;  Laterality: N/A;  Lumbar Two to Sacral One Decompressive Laminectomy  . Quadriceps tendon repair  07/16/2012    Procedure: REPAIR QUADRICEP TENDON;  Surgeon: Wylene Simmer, MD;  Location: [facility];  Service: Orthopedics;  Laterality: Right;    There were no vitals filed for this visit.  Visit Diagnosis:  Difficulty walking  Midline low back pain with sciatica, sciatica laterality unspecified  Hamstring tightness of both lower extremities      Subjective Assessment - 07/31/15 1016    Subjective Pt reports that he had diagnostic injections in his back Monday. Otherwise "Not too bad"   Currently in Pain? Yes   Pain Score 2    Pain Location Back   Pain Descriptors / Indicators Aching;Dull   Pain Type Chronic pain                         [facility] Adult PT Treatment/Exercise - 07/31/15 0001    Lumbar Exercises: Aerobic   Stationary Bike L2 x7  min    Tread Mill NuStep level 5 x5 minutes   Lumbar Exercises: Machines for Strengthening   Cybex Knee Extension #20 2x15; RLE #10 2x10   Cybex Knee Flexion 35lb 3x15   Leg Press 60lb 3x10 , RLE only #20 x15, heel raises 60lb 30 reps    Other Lumbar Machine Exercise Seated rows and lats 20#, straight arm pull downs 20# with focus on core contraction   Lumbar Exercises: Supine   Other Supine Lumbar Exercises feet on ball K2C, trunk rotation, small bridges and isometric abdominals   Modalities   Modalities Moist Heat;Electrical Stimulation   Moist Heat Therapy   Number Minutes Moist Heat 15 Minutes   Moist Heat Location Lumbar Spine   Electrical Stimulation   Electrical Stimulation Location lumbar spine   Electrical Stimulation Action IFC   Electrical Stimulation Parameters tolerance    Electrical Stimulation Goals Pain                  PT Short Term Goals - 05/19/15 1059    PT SHORT TERM GOAL #1   Title independent with initial HEP   Status Achieved           PT Long Term Goals - 07/22/15 1136    PT LONG TERM GOAL #1   Title  decrease pain 50%   Status Partially Met   PT LONG TERM GOAL #3   Title increase SLR to 65 degrees bilaterally   Status Achieved               Plan - 07/31/15 1058    Clinical Impression Statement Pt reports that he is having an series of diagnostic injections. Completed all interventions well, Continues to have limp due to the inability to achieve TKE with RLE. Spoke with pt about core strengthening and flexibility.   Pt will benefit from skilled therapeutic intervention in order to improve on the following deficits Decreased activity tolerance;Decreased mobility;Decreased strength;Decreased range of motion;Difficulty walking;Impaired flexibility;Pain   Rehab Potential Good   PT Frequency 2x / week   PT Duration 8 weeks   PT Treatment/Interventions ADLs/Self Care Home Management;Electrical Stimulation;Moist Heat;Therapeutic  exercise;Functional mobility training;Ultrasound;Traction;Patient/family education;Manual techniques   PT Next Visit Plan continue with LE flexibility and body mechanics        Problem List Patient Active Problem List   Diagnosis Date Noted  . Erectile dysfunction of organic origin 07/15/2013  . DDD (degenerative disc disease), lumbosacral 07/11/2013  . DDD (degenerative disc disease), cervical 07/11/2013  . DJD (degenerative joint disease) of knee 07/11/2013   PHYSICAL THERAPY DISCHARGE SUMMARY  Visits from Start of Care: 13   Plan: Patient agrees to discharge.  Patient goals were partially met. Patient is being discharged due to not returning since the last visit.  ?????       Scot Jun, PTA  07/31/2015, 11:02 AM  [place] [address] [place], [place], [address] Phone: [phone]   Fax:  [phone]  Name: Roger Wilcox MRN: [id number] Date of Birth: 1950/11/30

## 2016-10-13 DIAGNOSIS — K219 Gastro-esophageal reflux disease without esophagitis: Secondary | ICD-10-CM | POA: Insufficient documentation

## 2016-10-13 DIAGNOSIS — M48061 Spinal stenosis, lumbar region without neurogenic claudication: Secondary | ICD-10-CM | POA: Insufficient documentation

## 2018-10-14 ENCOUNTER — Encounter: Payer: Self-pay | Admitting: Internal Medicine

## 2019-09-07 ENCOUNTER — Ambulatory Visit: Payer: Medicare HMO | Attending: Internal Medicine

## 2019-09-07 DIAGNOSIS — Z23 Encounter for immunization: Secondary | ICD-10-CM | POA: Insufficient documentation

## 2019-09-07 NOTE — Progress Notes (Signed)
   Covid-19 Vaccination Clinic  Name:  Roger Wilcox    MRN: [id number] DOB: [birth year]  09/07/2019  [name] was observed post Covid-19 immunization for 15 minutes without incidence. He was provided with Vaccine Information Sheet and instruction to access the V-Safe system.   [name] was instructed to call 911 with any severe reactions post vaccine: Marland Kitchen Difficulty breathing  . Swelling of your face and throat  . A fast heartbeat  . A bad rash all over your body  . Dizziness and weakness    Immunizations Administered    Name Date Dose VIS Date Route   Pfizer COVID-19 Vaccine 09/07/2019  6:18 PM 0.3 mL 07/27/2019 Intramuscular   Manufacturer: [place]   Lot: [id number]   [place]: [id number]

## 2019-09-28 ENCOUNTER — Ambulatory Visit: Payer: Medicare HMO | Attending: Internal Medicine

## 2019-09-28 DIAGNOSIS — Z23 Encounter for immunization: Secondary | ICD-10-CM

## 2019-09-28 NOTE — Progress Notes (Signed)
   Covid-19 Vaccination Clinic  Name:  Roger Wilcox    MRN: [id number] DOB: [birth year]  09/28/2019  [name] was observed post Covid-19 immunization for 15 minutes without incidence. He was provided with Vaccine Information Sheet and instruction to access the V-Safe system.   [name] was instructed to call 911 with any severe reactions post vaccine: Marland Kitchen Difficulty breathing  . Swelling of your face and throat  . A fast heartbeat  . A bad rash all over your body  . Dizziness and weakness    Immunizations Administered    Name Date Dose VIS Date Route   Pfizer COVID-19 Vaccine 09/28/2019  5:42 PM 0.3 mL 07/27/2019 Intramuscular   Manufacturer: [place]   Lot: [id number]   [place]: [id number]

## 2019-10-15 DIAGNOSIS — G8929 Other chronic pain: Secondary | ICD-10-CM | POA: Insufficient documentation

## 2021-12-10 DIAGNOSIS — M48062 Spinal stenosis, lumbar region with neurogenic claudication: Secondary | ICD-10-CM | POA: Insufficient documentation

## 2021-12-28 ENCOUNTER — Other Ambulatory Visit: Payer: Self-pay | Admitting: Orthopaedic Surgery

## 2021-12-28 DIAGNOSIS — M48062 Spinal stenosis, lumbar region with neurogenic claudication: Secondary | ICD-10-CM

## 2021-12-31 ENCOUNTER — Ambulatory Visit
Admission: RE | Admit: 2021-12-31 | Discharge: 2021-12-31 | Disposition: A | Discharge status: Home / Self Care | Payer: Medicare HMO | Source: Ambulatory Visit | Attending: Orthopaedic Surgery | Admitting: Orthopaedic Surgery

## 2021-12-31 DIAGNOSIS — M48062 Spinal stenosis, lumbar region with neurogenic claudication: Secondary | ICD-10-CM

## 2021-12-31 MED ORDER — ONDANSETRON HCL 4 MG/2ML IJ SOLN: 4.0000 mg | Freq: Once | INTRAMUSCULAR | Status: DC | PRN

## 2021-12-31 MED ORDER — DIAZEPAM 5 MG PO TABS
5.0000 mg | ORAL_TABLET | Freq: Once | ORAL | Status: AC
  Administered 2021-12-31: 5 mg via ORAL

## 2021-12-31 MED ORDER — MEPERIDINE HCL 50 MG/ML IJ SOLN: 50.0000 mg | Freq: Once | INTRAMUSCULAR | Status: DC | PRN

## 2021-12-31 MED ORDER — IOPAMIDOL (ISOVUE-M 200) INJECTION 41%
15.0000 mL | Freq: Once | INTRAMUSCULAR | Status: AC
  Administered 2021-12-31: 15 mL via INTRATHECAL

## 2021-12-31 NOTE — Discharge Instructions (Signed)
Myelogram Discharge Instructions  Go home and rest quietly as needed. You may resume normal activities; however, do not exert yourself strongly or do any heavy lifting today and tomorrow.   DO NOT drive today.    You may resume your normal diet and medications unless otherwise indicated. Drink a lot of extra fluids today and tomorrow.   The incidence of a spinal headache is about 5% (one in 20 patients).  If you develop a headache when you are sitting up or standing that gets better when you lie down, please lie flat for 24 hours and drink plenty of fluids until the headache goes away.  Caffeinated beverages may be helpful.   If you develop severe nausea and vomiting or a headache that does not go away with the flat bedrest after 48 hours, please call [phone].   Call your physician for a follow-up appointment.  The results of your myelogram will be sent directly to your physician by the following day.  Please call us at [phone] if you have any questions or if complications develop after you arrive home.   Discharge instructions have been explained to the patient.  The patient, or the person responsible for the patient, state they fully understands these instructions.   Thank you for visiting our office today.

## 2022-12-28 IMAGING — CR DG MYELOGRAPHY LUMBAR INJ LUMBOSACRAL
13 of 16 series · 13 of 16 positions shown · non-contrast
Comparison: MRI lumbar spine dated June 16, 2021.

CLINICAL DATA: Chronic low back and bilateral leg pain. Bilateral
foot numbness. Difficulty walking more than 50 feet due to pain.
History of prior lumbar laminectomy ten years ago.
TECHNIQUE: Contiguous axial images were obtained through the lumbar spine after
the intrathecal infusion of contrast. Coronal and sagittal
reconstructions were obtained of the axial image sets.

[vasc adipose (1 of 11)]
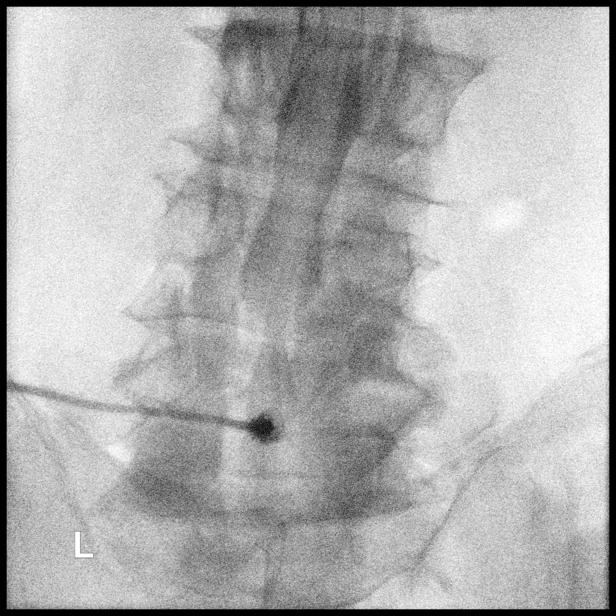

[w lumbar spine lat]
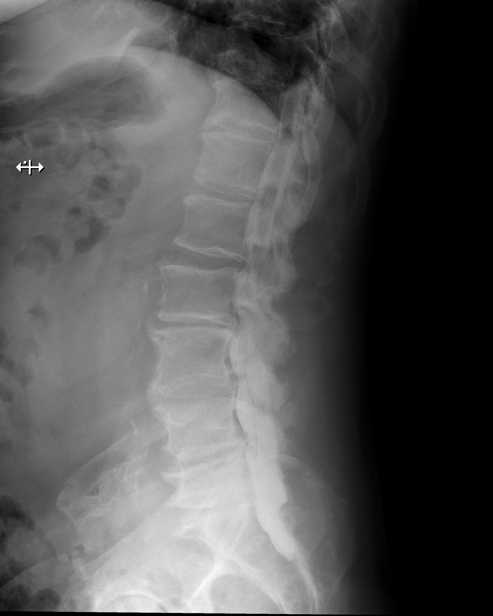

[vasc adipose (2 of 11)]
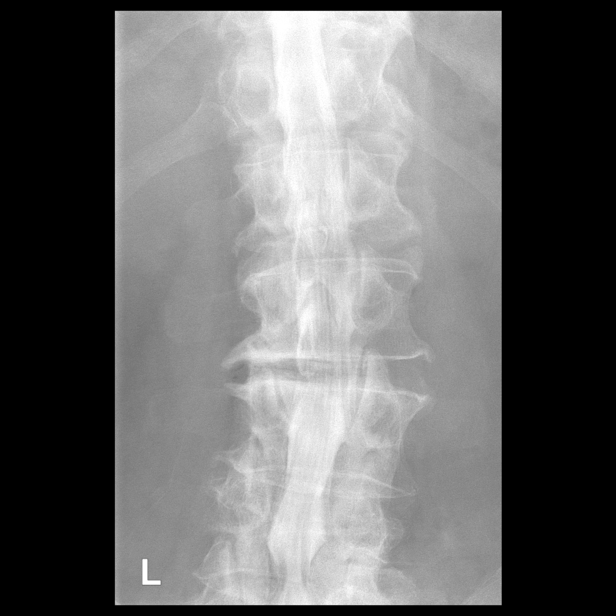

[w lumbar spine extension]
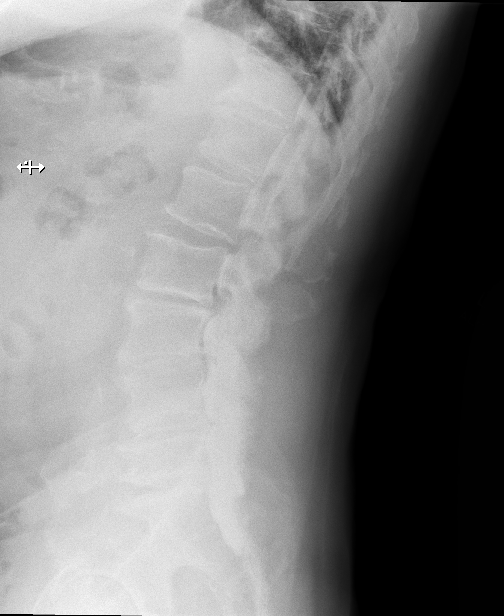

[vasc adipose (3 of 11)]
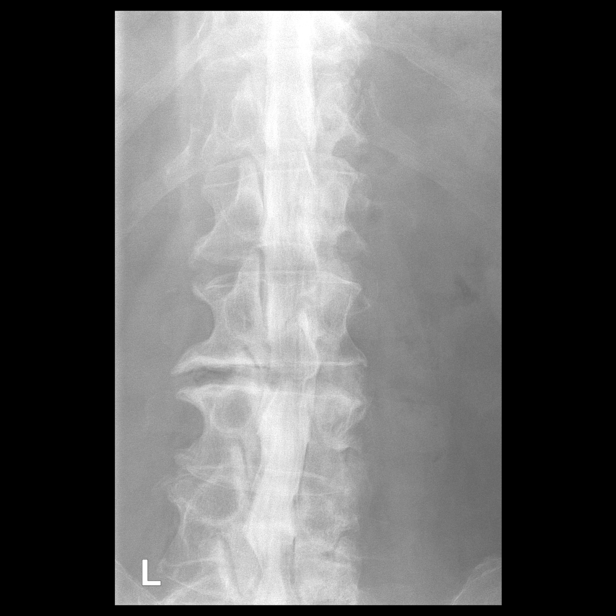

[vasc adipose (4 of 11)]
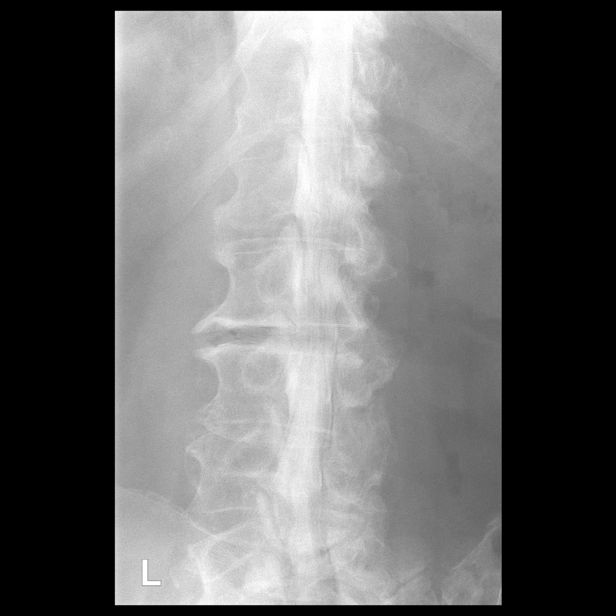

[vasc adipose (5 of 11)]
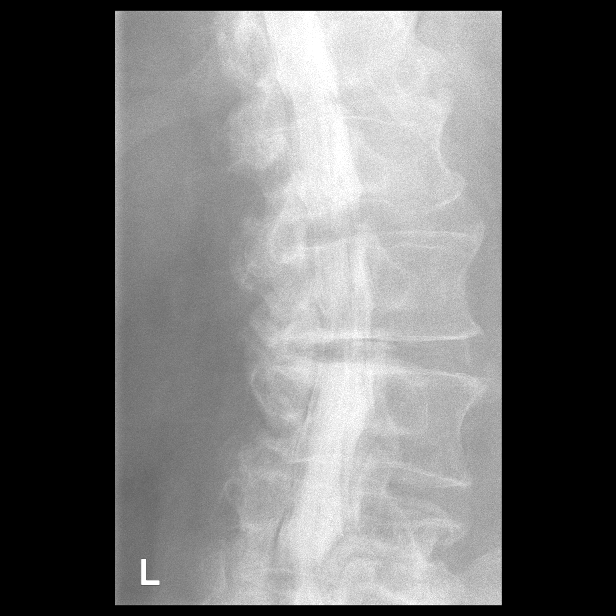

[vasc adipose (6 of 11)]
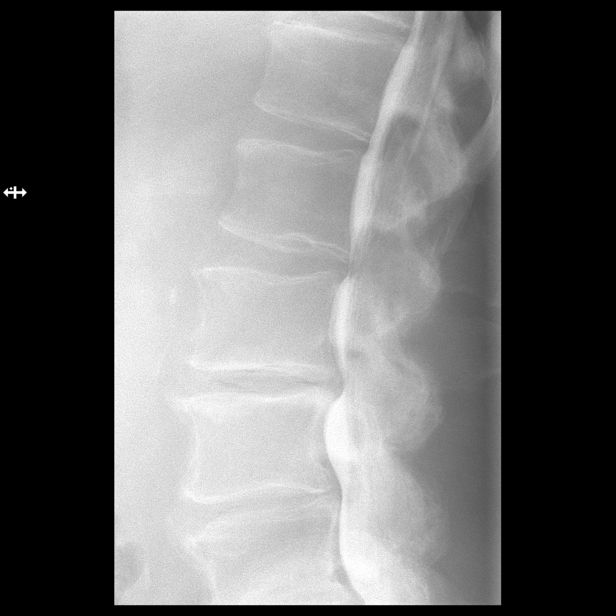

[vasc adipose (7 of 11)]
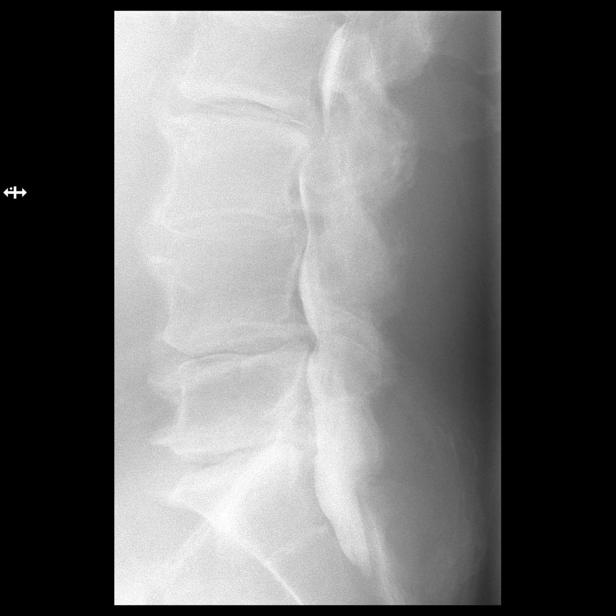

[vasc adipose (8 of 11)]
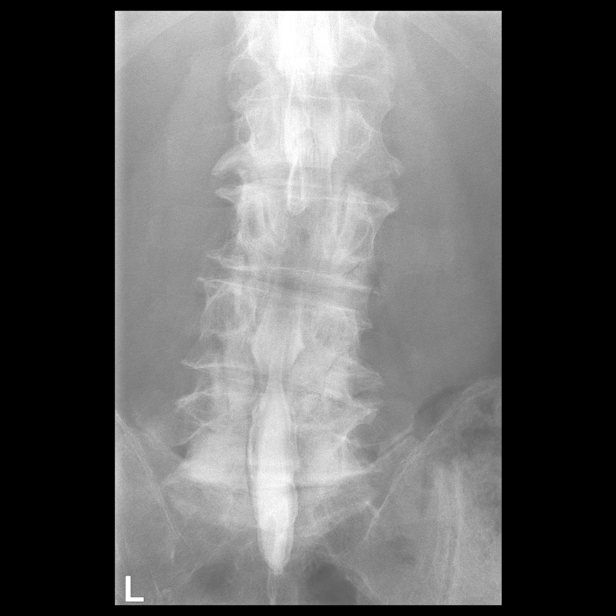

[vasc adipose (9 of 11)]
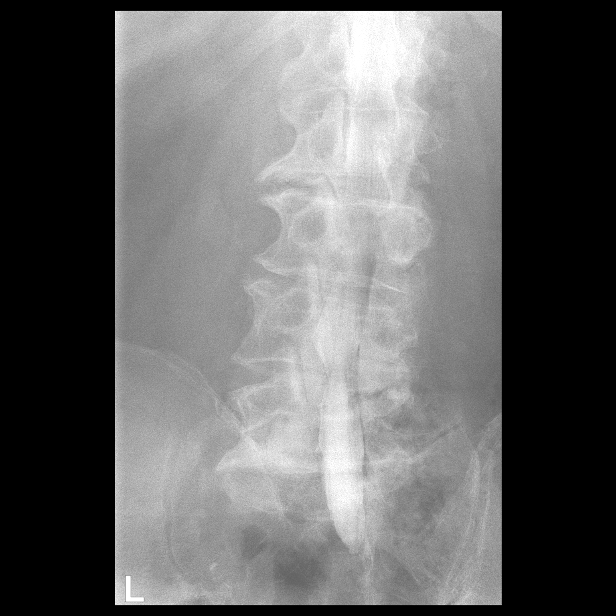

[vasc adipose (10 of 11)]
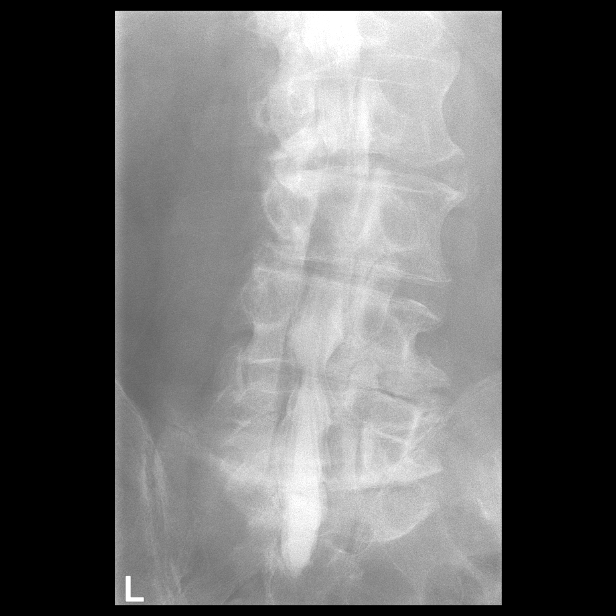

[vasc adipose (11 of 11)]
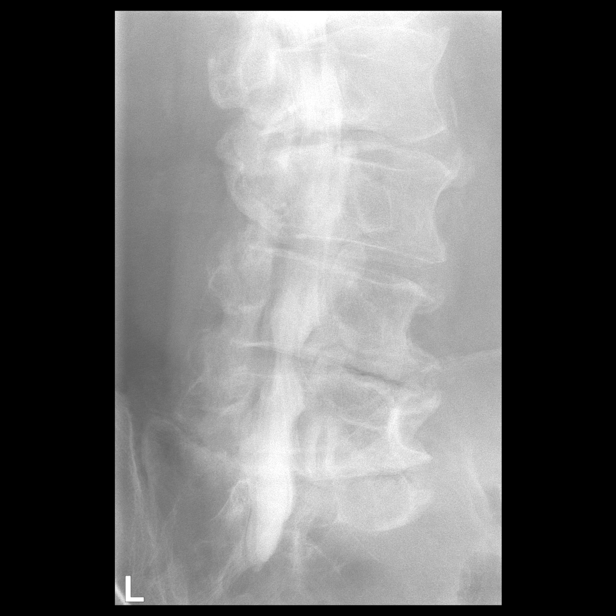

[13 of 16 positions shown; findings below may reference images not displayed]

EXAM:
LUMBAR MYELOGRAM

CT LUMBAR MYELOGRAM

FLUOROSCOPY TIME:  Radiation Exposure Index (as provided by the
fluoroscopic device): 12.2 mGy Kerma

PROCEDURE:
After thorough discussion of risks and benefits of the procedure
including bleeding, infection, injury to nerves, blood vessels,
adjacent structures as well as headache and CSF leak, written and
oral informed consent was obtained. Consent was obtained by Dr.
Paulus N Ceejay. Time out form was completed.

Patient was positioned prone on the fluoroscopy table. Local
anesthesia was provided with 1% lidocaine without epinephrine after
prepped and draped in the usual sterile fashion. Puncture was
performed at L5 using a 3 1/2 inch 22-gauge spinal needle via
midline approach. Using a single pass through the dura, the needle
was placed within the thecal sac, with return of clear CSF. 15 mL of
Isovue D-QSS was injected into the thecal sac, with normal
opacification of the nerve roots and cauda equina consistent with
free flow within the subarachnoid space.

I personally performed the lumbar puncture and administered the
intrathecal contrast. I also personally supervised acquisition of
the myelogram images.
FINDINGS: LUMBAR MYELOGRAM FINDINGS:

Trace anterolisthesis at L3-L4 and L4-L5. No dynamic instability.
Small ventral extradural defects from L1-L2 through L4-L5. Moderate
spinal canal stenosis at L1-L2 and L2-L3. Underfilling of the left
L2 and L3 nerve roots. Mild bilateral lateral recess stenosis at
L4-L5.

CT LUMBAR MYELOGRAM FINDINGS:

Segmentation: Standard.

Alignment: Unchanged trace anterolisthesis at L3-L4 and L4-L5.
Unchanged trace retrolisthesis at L5-S1.

Vertebrae: No acute fracture or other focal pathologic process.

Conus medullaris and cauda equina: Conus extends to the L1-L2 level.
Conus and cauda equina appear normal.

Paraspinal and other soft tissues: Aortoiliac atherosclerotic
vascular disease. Mild bilateral sacroiliac joint osteoarthritis.

Disc levels:

T11-T12: Unchanged tiny left paracentral disc osteophyte complex. No
stenosis.

T12-L1: Negative disc. Unchanged mild bilateral facet arthropathy.
No stenosis.

L1-L2: Unchanged mild disc bulging and mild-to-moderate bilateral
facet arthropathy. Unchanged moderate spinal canal stenosis and mild
left neuroforaminal stenosis. No right neuroforaminal stenosis.

L2-L3: Unchanged moderate disc bulging with left far lateral disc
osteophyte complex and small central disc osteophyte protrusion.
Unchanged moderate to severe left and mild-to-moderate right facet
arthropathy. Unchanged moderate spinal canal and moderate left and
mild right lateral recess stenosis. Unchanged moderate left
neuroforaminal stenosis. No right neuroforaminal stenosis.

L3-L4: Prior posterior decompression. Unchanged small
circumferential disc osteophyte complex eccentric to the left and
severe right and moderate left facet arthropathy. Unchanged moderate
to severe left and mild right neuroforaminal stenosis. No spinal
canal stenosis.

L4-L5: Prior posterior decompression. Unchanged small
circumferential disc osteophyte complex eccentric to the right with
severe right and moderate left facet arthropathy. Unchanged mild
bilateral lateral recess stenosis. Unchanged severe right and
moderate left neuroforaminal stenosis. No spinal canal stenosis.

L5-S1: Prior posterior decompression. Unchanged moderate
circumferential disc osteophyte complex and bilateral facet
arthropathy. Unchanged mild to moderate bilateral neuroforaminal
stenosis. No spinal canal stenosis.
IMPRESSION: 1. Unchanged multilevel lumbar spondylosis as described above with
moderate spinal canal stenosis at L1-L2 and L2-L3.
2. Prior L3-S1 posterior decompression with residual multilevel
neuroforaminal stenosis, severe on the right at L4-L5, and moderate
to severe on the left at L3-L4. No residual spinal canal stenosis.
3. Aortic Atherosclerosis ([id number]).

## 2022-12-28 IMAGING — CT CT L SPINE W/ CM
1 of 7 series · 6 of 14 positions shown, 8 images · non-contrast
Comparison: MRI lumbar spine dated June 16, 2021.

CLINICAL DATA: Chronic low back and bilateral leg pain. Bilateral
foot numbness. Difficulty walking more than 50 feet due to pain.
History of prior lumbar laminectomy ten years ago.
TECHNIQUE: Contiguous axial images were obtained through the lumbar spine after
the intrathecal infusion of contrast. Coronal and sagittal
reconstructions were obtained of the axial image sets.

[Series 3: l spine soft · axial · 0.37mm/px · z∈[+214,+392]mm · 6 of 125 slices shown, 8 images]
[im 18/125  soft-tissue]
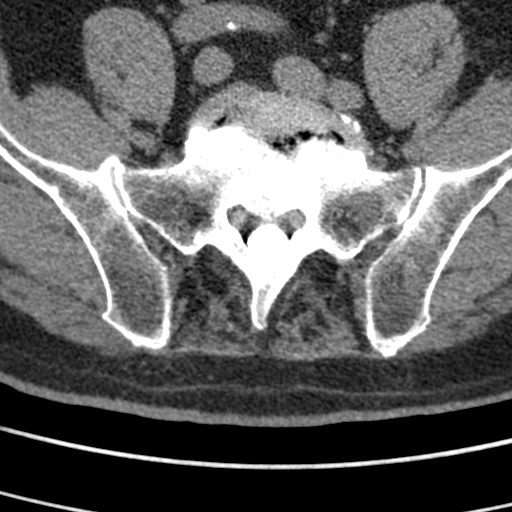
[im 18/125  bone]
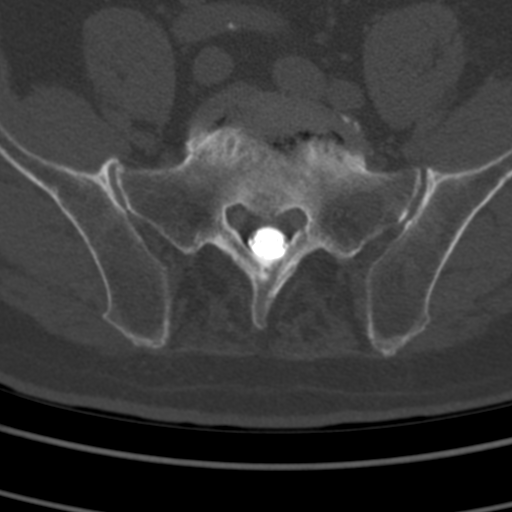
[im 36/125  bone]
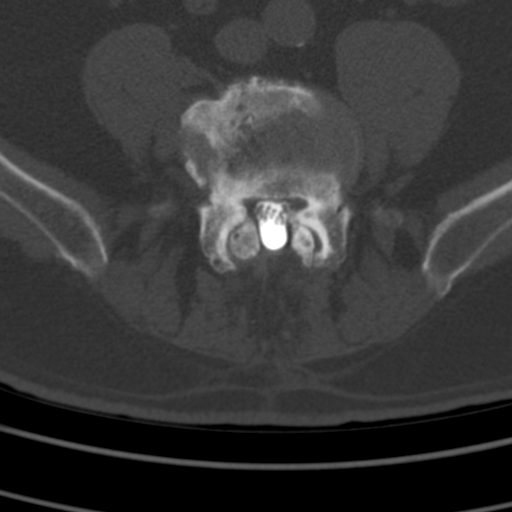
[im 54/125  bone]
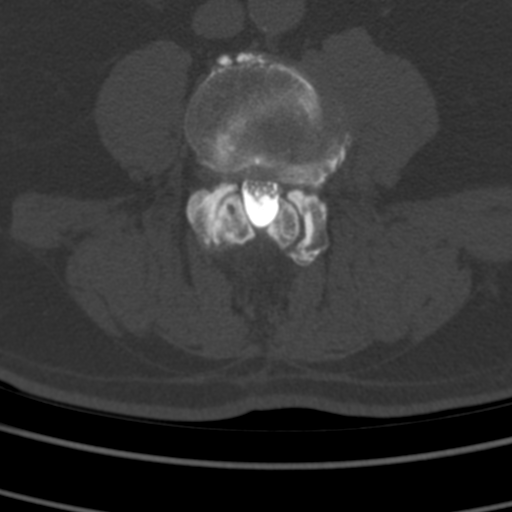
[im 71/125  bone]
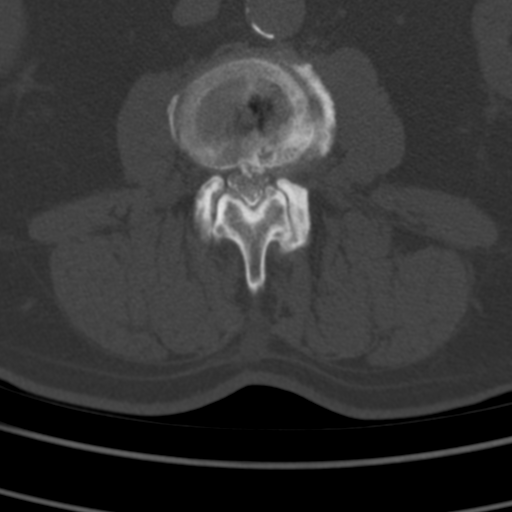
[im 89/125  soft-tissue]
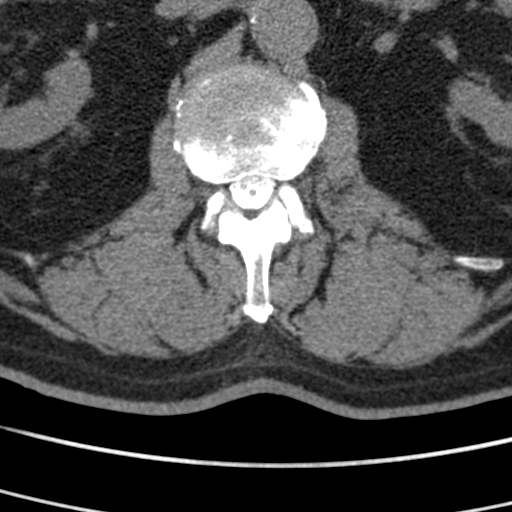
[im 89/125  bone]
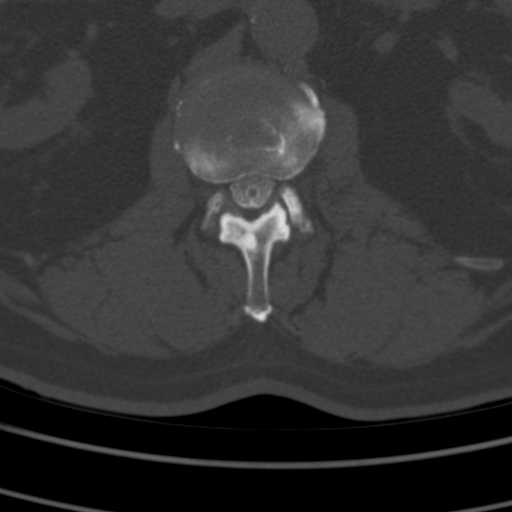
[im 107/125  bone]
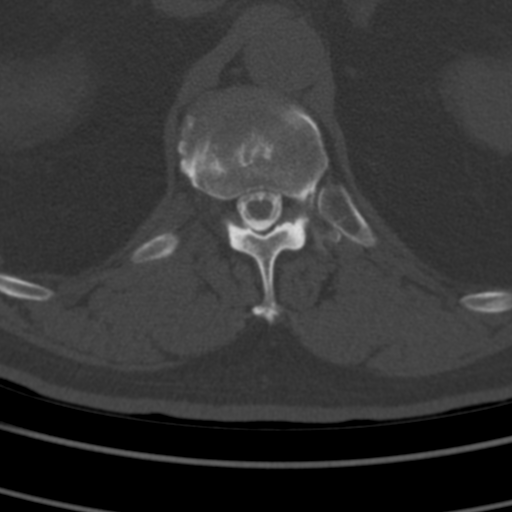

[6 of 14 positions shown; findings below may reference images not displayed]

EXAM:
LUMBAR MYELOGRAM

CT LUMBAR MYELOGRAM

FLUOROSCOPY TIME:  Radiation Exposure Index (as provided by the
fluoroscopic device): 12.2 mGy Kerma

PROCEDURE:
After thorough discussion of risks and benefits of the procedure
including bleeding, infection, injury to nerves, blood vessels,
adjacent structures as well as headache and CSF leak, written and
oral informed consent was obtained. Consent was obtained by Dr.
Paulus N Ceejay. Time out form was completed.

Patient was positioned prone on the fluoroscopy table. Local
anesthesia was provided with 1% lidocaine without epinephrine after
prepped and draped in the usual sterile fashion. Puncture was
performed at L5 using a 3 1/2 inch 22-gauge spinal needle via
midline approach. Using a single pass through the dura, the needle
was placed within the thecal sac, with return of clear CSF. 15 mL of
Isovue D-QSS was injected into the thecal sac, with normal
opacification of the nerve roots and cauda equina consistent with
free flow within the subarachnoid space.

I personally performed the lumbar puncture and administered the
intrathecal contrast. I also personally supervised acquisition of
the myelogram images.
FINDINGS: LUMBAR MYELOGRAM FINDINGS:

Trace anterolisthesis at L3-L4 and L4-L5. No dynamic instability.
Small ventral extradural defects from L1-L2 through L4-L5. Moderate
spinal canal stenosis at L1-L2 and L2-L3. Underfilling of the left
L2 and L3 nerve roots. Mild bilateral lateral recess stenosis at
L4-L5.

CT LUMBAR MYELOGRAM FINDINGS:

Segmentation: Standard.

Alignment: Unchanged trace anterolisthesis at L3-L4 and L4-L5.
Unchanged trace retrolisthesis at L5-S1.

Vertebrae: No acute fracture or other focal pathologic process.

Conus medullaris and cauda equina: Conus extends to the L1-L2 level.
Conus and cauda equina appear normal.

Paraspinal and other soft tissues: Aortoiliac atherosclerotic
vascular disease. Mild bilateral sacroiliac joint osteoarthritis.

Disc levels:

T11-T12: Unchanged tiny left paracentral disc osteophyte complex. No
stenosis.

T12-L1: Negative disc. Unchanged mild bilateral facet arthropathy.
No stenosis.

L1-L2: Unchanged mild disc bulging and mild-to-moderate bilateral
facet arthropathy. Unchanged moderate spinal canal stenosis and mild
left neuroforaminal stenosis. No right neuroforaminal stenosis.

L2-L3: Unchanged moderate disc bulging with left far lateral disc
osteophyte complex and small central disc osteophyte protrusion.
Unchanged moderate to severe left and mild-to-moderate right facet
arthropathy. Unchanged moderate spinal canal and moderate left and
mild right lateral recess stenosis. Unchanged moderate left
neuroforaminal stenosis. No right neuroforaminal stenosis.

L3-L4: Prior posterior decompression. Unchanged small
circumferential disc osteophyte complex eccentric to the left and
severe right and moderate left facet arthropathy. Unchanged moderate
to severe left and mild right neuroforaminal stenosis. No spinal
canal stenosis.

L4-L5: Prior posterior decompression. Unchanged small
circumferential disc osteophyte complex eccentric to the right with
severe right and moderate left facet arthropathy. Unchanged mild
bilateral lateral recess stenosis. Unchanged severe right and
moderate left neuroforaminal stenosis. No spinal canal stenosis.

L5-S1: Prior posterior decompression. Unchanged moderate
circumferential disc osteophyte complex and bilateral facet
arthropathy. Unchanged mild to moderate bilateral neuroforaminal
stenosis. No spinal canal stenosis.
IMPRESSION: 1. Unchanged multilevel lumbar spondylosis as described above with
moderate spinal canal stenosis at L1-L2 and L2-L3.
2. Prior L3-S1 posterior decompression with residual multilevel
neuroforaminal stenosis, severe on the right at L4-L5, and moderate
to severe on the left at L3-L4. No residual spinal canal stenosis.
3. Aortic Atherosclerosis ([id number]).
